# Patient Record
Sex: Male | Born: 1965 | Race: Black or African American | Hispanic: No | Marital: Single | Smoking: Current every day smoker
Health system: Southern US, Community
[De-identification: ages and names within clinical notes are randomized; demographics above are authoritative.]

## PROBLEM LIST (undated history)

## (undated) ENCOUNTER — Emergency Department (HOSPITAL_COMMUNITY): Admission: EM | Payer: Self-pay | Source: Home / Self Care

## (undated) DIAGNOSIS — T7840XA Allergy, unspecified, initial encounter: Secondary | ICD-10-CM

## (undated) DIAGNOSIS — I82409 Acute embolism and thrombosis of unspecified deep veins of unspecified lower extremity: Secondary | ICD-10-CM

## (undated) HISTORY — DX: Allergy, unspecified, initial encounter: T78.40XA

## (undated) HISTORY — PX: HERNIA REPAIR: SHX51

---

## 1997-08-22 ENCOUNTER — Emergency Department (HOSPITAL_COMMUNITY): Admission: EM | Admit: 1997-08-22 | Discharge: 1997-08-22 | Payer: Self-pay

## 1998-04-23 ENCOUNTER — Emergency Department (HOSPITAL_COMMUNITY): Admission: EM | Admit: 1998-04-23 | Discharge: 1998-04-23 | Payer: Self-pay | Admitting: Internal Medicine

## 1998-05-09 ENCOUNTER — Emergency Department (HOSPITAL_COMMUNITY): Admission: EM | Admit: 1998-05-09 | Discharge: 1998-05-09 | Payer: Self-pay | Admitting: Internal Medicine

## 1999-02-12 DIAGNOSIS — Z87898 Personal history of other specified conditions: Secondary | ICD-10-CM

## 1999-12-30 ENCOUNTER — Encounter: Payer: Self-pay | Admitting: Emergency Medicine

## 1999-12-30 ENCOUNTER — Inpatient Hospital Stay (HOSPITAL_COMMUNITY): Admission: EM | Admit: 1999-12-30 | Discharge: 2000-01-04 | Payer: Self-pay | Admitting: Emergency Medicine

## 2000-01-02 ENCOUNTER — Encounter: Payer: Self-pay | Admitting: General Surgery

## 2003-05-30 ENCOUNTER — Emergency Department (HOSPITAL_COMMUNITY): Admission: EM | Admit: 2003-05-30 | Discharge: 2003-05-30 | Payer: Self-pay | Admitting: Emergency Medicine

## 2004-03-13 ENCOUNTER — Emergency Department (HOSPITAL_COMMUNITY): Admission: EM | Admit: 2004-03-13 | Discharge: 2004-03-13 | Payer: Self-pay | Admitting: Emergency Medicine

## 2005-09-08 ENCOUNTER — Emergency Department (HOSPITAL_COMMUNITY): Admission: EM | Admit: 2005-09-08 | Discharge: 2005-09-08 | Payer: Self-pay | Admitting: Emergency Medicine

## 2006-11-11 ENCOUNTER — Ambulatory Visit: Payer: Self-pay | Admitting: Nurse Practitioner

## 2006-11-11 ENCOUNTER — Ambulatory Visit (HOSPITAL_COMMUNITY): Admission: RE | Admit: 2006-11-11 | Discharge: 2006-11-11 | Payer: Self-pay | Admitting: Nurse Practitioner

## 2006-11-11 ENCOUNTER — Ambulatory Visit: Payer: Self-pay | Admitting: *Deleted

## 2006-11-11 DIAGNOSIS — S335XXA Sprain of ligaments of lumbar spine, initial encounter: Secondary | ICD-10-CM | POA: Insufficient documentation

## 2006-11-11 LAB — CONVERTED CEMR LAB
Bilirubin Urine: NEGATIVE
HDL: 29 mg/dL — ABNORMAL LOW (ref >39)
Ketones, urine, test strip: NEGATIVE
Specific Gravity, Urine: >=1.03
Urobilinogen, UA: 1
pH: 5.5

## 2006-11-13 ENCOUNTER — Encounter (INDEPENDENT_AMBULATORY_CARE_PROVIDER_SITE_OTHER): Payer: Self-pay | Admitting: Nurse Practitioner

## 2006-11-13 LAB — CONVERTED CEMR LAB
Albumin: 4.6 g/dL (ref 3.5–5.2)
CO2: 20 meq/L (ref 19–32)
Calcium: 9.3 mg/dL (ref 8.4–10.5)
Chloride: 106 meq/L (ref 96–112)
Glucose, Bld: 86 mg/dL (ref 70–99)
Potassium: 4.2 meq/L (ref 3.5–5.3)
Sodium: 140 meq/L (ref 135–145)
TSH: 1.173 microintl units/mL (ref 0.350–5.50)
Total Protein: 7.9 g/dL (ref 6.0–8.3)

## 2006-11-25 ENCOUNTER — Ambulatory Visit: Payer: Self-pay | Admitting: Nurse Practitioner

## 2006-11-25 DIAGNOSIS — K219 Gastro-esophageal reflux disease without esophagitis: Secondary | ICD-10-CM

## 2006-11-25 DIAGNOSIS — E7800 Pure hypercholesterolemia, unspecified: Secondary | ICD-10-CM

## 2006-12-10 ENCOUNTER — Telehealth (INDEPENDENT_AMBULATORY_CARE_PROVIDER_SITE_OTHER): Payer: Self-pay | Admitting: *Deleted

## 2006-12-19 ENCOUNTER — Encounter (INDEPENDENT_AMBULATORY_CARE_PROVIDER_SITE_OTHER): Payer: Self-pay | Admitting: Nurse Practitioner

## 2006-12-29 ENCOUNTER — Ambulatory Visit: Payer: Self-pay | Admitting: Nurse Practitioner

## 2007-01-07 ENCOUNTER — Encounter (INDEPENDENT_AMBULATORY_CARE_PROVIDER_SITE_OTHER): Payer: Self-pay | Admitting: Nurse Practitioner

## 2007-01-13 ENCOUNTER — Encounter: Admission: RE | Admit: 2007-01-13 | Discharge: 2007-04-13 | Payer: Self-pay | Admitting: Nurse Practitioner

## 2007-01-13 ENCOUNTER — Encounter (INDEPENDENT_AMBULATORY_CARE_PROVIDER_SITE_OTHER): Payer: Self-pay | Admitting: Nurse Practitioner

## 2007-01-30 ENCOUNTER — Telehealth (INDEPENDENT_AMBULATORY_CARE_PROVIDER_SITE_OTHER): Payer: Self-pay | Admitting: *Deleted

## 2007-02-27 ENCOUNTER — Encounter (INDEPENDENT_AMBULATORY_CARE_PROVIDER_SITE_OTHER): Payer: Self-pay | Admitting: Nurse Practitioner

## 2007-05-07 ENCOUNTER — Emergency Department (HOSPITAL_COMMUNITY): Admission: EM | Admit: 2007-05-07 | Discharge: 2007-05-07 | Payer: Self-pay | Admitting: Family Medicine

## 2007-05-10 ENCOUNTER — Emergency Department (HOSPITAL_COMMUNITY): Admission: EM | Admit: 2007-05-10 | Discharge: 2007-05-10 | Payer: Self-pay | Admitting: Emergency Medicine

## 2007-05-21 ENCOUNTER — Encounter (INDEPENDENT_AMBULATORY_CARE_PROVIDER_SITE_OTHER): Payer: Self-pay | Admitting: Nurse Practitioner

## 2007-08-25 ENCOUNTER — Emergency Department (HOSPITAL_COMMUNITY): Admission: EM | Admit: 2007-08-25 | Discharge: 2007-08-25 | Payer: Self-pay | Admitting: Family Medicine

## 2007-09-09 ENCOUNTER — Ambulatory Visit: Payer: Self-pay | Admitting: Nurse Practitioner

## 2007-09-09 DIAGNOSIS — M940 Chondrocostal junction syndrome [Tietze]: Secondary | ICD-10-CM

## 2008-11-12 ENCOUNTER — Emergency Department (HOSPITAL_COMMUNITY): Admission: EM | Admit: 2008-11-12 | Discharge: 2008-11-12 | Payer: Self-pay | Admitting: Emergency Medicine

## 2009-12-22 ENCOUNTER — Emergency Department (HOSPITAL_COMMUNITY): Admission: EM | Admit: 2009-12-22 | Discharge: 2009-12-22 | Payer: Self-pay | Admitting: Emergency Medicine

## 2009-12-25 ENCOUNTER — Ambulatory Visit: Payer: Self-pay | Admitting: Nurse Practitioner

## 2009-12-25 DIAGNOSIS — F17200 Nicotine dependence, unspecified, uncomplicated: Secondary | ICD-10-CM

## 2009-12-25 DIAGNOSIS — J029 Acute pharyngitis, unspecified: Secondary | ICD-10-CM

## 2009-12-26 DIAGNOSIS — E049 Nontoxic goiter, unspecified: Secondary | ICD-10-CM | POA: Insufficient documentation

## 2009-12-26 LAB — CONVERTED CEMR LAB
ALT: 20 units/L (ref 0–53)
Albumin: 4.7 g/dL (ref 3.5–5.2)
Alkaline Phosphatase: 35 units/L — ABNORMAL LOW (ref 39–117)
Basophils Absolute: 0.1 10*3/uL (ref 0.0–0.1)
CO2: 26 meq/L (ref 19–32)
Glucose, Bld: 90 mg/dL (ref 70–99)
Lymphocytes Relative: 41 % (ref 12–46)
Lymphs Abs: 3.8 10*3/uL (ref 0.7–4.0)
Neutrophils Relative %: 51 % (ref 43–77)
Platelets: 197 10*3/uL (ref 150–400)
Potassium: 5.1 meq/L (ref 3.5–5.3)
RDW: 15.2 % (ref 11.5–15.5)
Sodium: 136 meq/L (ref 135–145)
TSH: 131.625 microintl units/mL — ABNORMAL HIGH (ref 0.350–4.500)
Total Protein: 8.2 g/dL (ref 6.0–8.3)
WBC: 9.2 10*3/uL (ref 4.0–10.5)

## 2009-12-27 ENCOUNTER — Ambulatory Visit (HOSPITAL_COMMUNITY): Admission: RE | Admit: 2009-12-27 | Discharge: 2009-12-27 | Payer: Self-pay | Admitting: Internal Medicine

## 2009-12-28 ENCOUNTER — Telehealth (INDEPENDENT_AMBULATORY_CARE_PROVIDER_SITE_OTHER): Payer: Self-pay | Admitting: Nurse Practitioner

## 2010-02-21 ENCOUNTER — Telehealth (INDEPENDENT_AMBULATORY_CARE_PROVIDER_SITE_OTHER): Payer: Self-pay | Admitting: Nurse Practitioner

## 2010-02-22 ENCOUNTER — Ambulatory Visit: Admit: 2010-02-22 | Payer: Self-pay | Admitting: Nurse Practitioner

## 2010-02-26 ENCOUNTER — Encounter (INDEPENDENT_AMBULATORY_CARE_PROVIDER_SITE_OTHER): Payer: Self-pay | Admitting: Nurse Practitioner

## 2010-03-15 NOTE — Progress Notes (Signed)
Summary: TEST RESULT  Phone Note Call from Patient Call back at [phone]   Summary of Call: PATIENT IS CALLING FOR TEST RESULTS Initial call taken by: Domenic Polite,  December 28, 2009 4:17 PM  Follow-up for Phone Call        Notified of test results, new Rx and f/u lab appt. made. Follow-up by: Dutch Quint RN,  December 28, 2009 4:40 PM

## 2010-03-15 NOTE — Assessment & Plan Note (Signed)
Summary: Sore throat   Vital Signs:  Patient profile:   45 year old male Weight:      208 pounds BMI:     28.31 Temp:     98.2 degrees F oral Pulse rate:   68 / minute Pulse rhythm:   regular Resp:     16 per minute BP sitting:   130 / 84  (left arm) Cuff size:   regular  Vitals Entered By: Hale Drone CMA (December 25, 2009 4:47 PM)  Nutrition Counseling: Patient's BMI is greater than 25 and therefore counseled on weight management options. CC: Pt. is here b/c of a knot on the throat since last Monday. No pain associated but states he does wake up in the middle of the night coughing w/ a sensation of choking. Pt. sometimes has difficulty breathing. Can not wear button up shirts b/c it is uncomfortable. Pt. also feels it is affecting his voice.  Is Patient Diabetic? Yes Pain Assessment Patient in pain? no       Does patient need assistance? Functional Status Self care Ambulation Normal   CC:  Pt. is here b/c of a knot on the throat since last Monday. No pain associated but states he does wake up in the middle of the night coughing w/ a sensation of choking. Pt. sometimes has difficulty breathing. Can not wear button up shirts b/c it is uncomfortable. Pt. also feels it is affecting his voice. Marland Kitchen  History of Present Illness:  pt into the office with c/o sore throat Started 1 week ago +hoarseness "I feel like something is stuck in my throat" Symptoms started the day after he was hollering during an [employer] game. +tobacco use No change in appetite Area seems to enlarge with talking  Habits & Providers  Alcohol-Tobacco-Diet     Alcohol drinks/day: 0     Tobacco Status: current     Cigarette Packs/Day: 4 cigs daily     Year Started: 1985     Passive Smoke Exposure: no  Exercise-Depression-Behavior     Does Patient Exercise: no     Drug Use: no     Seat Belt Use: 100     Sun Exposure: occasionally  Current Medications (verified): 1)  Flexeril 10 Mg  Tabs  (Cyclobenzaprine Hcl) .Marland Kitchen.. 1 Tablet By Mouth At Night For Muscles 2)  Cyclobenzaprine Hcl 10 Mg  Tabs (Cyclobenzaprine Hcl) .... Take 1 Tab 3 Times A Day For Muscle Spasm *mark Mcpherson, Md 3)  Ibuprofen 800 Mg  Tabs (Ibuprofen) .... Take One Tablet 3 Times A Day As Need For Swelling and Pain *mark Macpherson,md  Allergies (verified): No Known Drug Allergies  Review of Systems General:  Denies fever and loss of appetite. ENT:  Complains of hoarseness and sore throat. CV:  Denies chest pain or discomfort. Resp:  Denies cough. GI:  Denies abdominal pain, nausea, and vomiting.  Physical Exam  General:  alert.   Head:  normocephalic.   Neck:  ? right thyroid enlargement Heart:  normal rate and regular rhythm.   Abdomen:  normal bowel sounds.   Msk:  normal ROM.   Neurologic:  alert & oriented X3.     Impression & Recommendations:  Problem # 1:  SORE THROAT (ICD-462) ? if problems due to pharngitis handout given  advised rest of vocal cords out of work for next 2 days due to need for excessive talking/shouting at work if symptoms persist then may need ENT referral The following medications were removed from  the medication list:    Ibuprofen 800 Mg Tabs (Ibuprofen) .Marland Kitchen... 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 3)  If symptoms no better by the end of this week then inform this office.  You will need referral to specialist to look down your throat. 4)  You have declined the flu  vaccine today - if you change your mind inform this office.   Orders Added: 1)  Est. Patient Level III [99213] 2)  T-TSH [[id number]] 3)  T-CBC w/Diff [[id number]] 4)  T-Comprehensive Metabolic Panel [[id number]] 5)  Rapid HIV  [92370]

## 2010-03-15 NOTE — Letter (Signed)
Summary: Handout Printed  Printed Handout:  - Laryngitis 

## 2010-03-15 NOTE — Letter (Signed)
Summary: Generic Letter  [facility]  [address] [place], [place] [address]   Phone: [phone]  Fax: [phone]        02/26/2010  Curtis Barr [address] [place], [place]  [address]  Dear Curtis Barr,  We have been unable to contact you by telephone.  Please call our office, at your earliest convenience, so that we may speak with you.  Sincerely,   Dutch Quint RN

## 2010-03-15 NOTE — Progress Notes (Signed)
Summary: PULLED A MUSCLE IN BACK  Phone Note Call from Patient Call back at Home Phone [phone]   Reason for Call: Talk to Nurse Summary of Call: Curtis PT. MR Curtis Barr THAT HE PULLED A MUSCLE IN HIS UPPER BACK ON HIS JOB LAST FRIDAY. HE IS WONDERING IF YOU CAN PRESCRIBE FLEXERIL AGAIN FOR HIM. HE Barr THAT YOU HAD GIVEN HIM SOME BEFORE FOR HIS BACK.  HE USES [facility] ON [address] Initial call taken by: [name],  February 21, 2010 10:45 AM  Follow-up for Phone Call        Was pulling a heavy water hose at work on Friday, pulled muscle in left upper back/shoulder, posterior arm, very painful, sharp pain, getting progressively worse.  Hurts to breathe, also when reaching up.  Pain occurs only when he moves, hurts badly when he gets up in the morning.  Has tried ibuprofen, without relief.  No relief with warm showers.   Did not report injury - advised to report work injury as soon as possible.  Would like flexeril. Follow-up by: Dutch Quint RN,  February 21, 2010 11:01 AM  Additional Follow-up for Phone Call Additional follow up Details #1::        Yes, pt did have flexeril before so will refil. advise pt to take ibuprofen 800mg  by mouth two times a day with food (rx written) and may take flexeril 10mg  by mouth at night (rx in basket) This will likely take 5-7 days for resolution and then when even then he will need to limit lifting and heavy pulling to prevent recurrence.  applying a heat pad to the affected area is helpful Additional Follow-up by: Lehman Prom FNP,  February 22, 2010 8:00 AM    Additional Follow-up for Phone Call Additional follow up Details #2::    Rxs faxed to [facility] [address]  Left message on answering machine for pt. to return call.  Dutch Quint RN  February 22, 2010 10:46 AM  Left message on answering machine for pt. to return call.  Dutch Quint RN  February 22, 2010 3:10 PM  Left message on answering machine for pt. to return call.  Dutch Quint RN   February 23, 2010 12:57 PM  Left message on answering machine for pt. to return call.  Letter sent.  Dutch Quint RN  February 26, 2010 5:04 PM   New/Updated Medications: CYCLOBENZAPRINE HCL 10 MG TABS (CYCLOBENZAPRINE HCL) One tablet by mouth nightly as needed for muscles IBUPROFEN 800 MG TABS (IBUPROFEN) One tablet by mouth two times a day as needed for pain Prescriptions: IBUPROFEN 800 MG TABS (IBUPROFEN) One tablet by mouth two times a day as needed for pain  #50 x 0   Entered and Authorized by:   Lehman Prom FNP   Signed by:   Lehman Prom FNP on 02/22/2010   Method used:   Printed then faxed to ...       [facility] [address] [phone]* (retail)       [address]       [place], [place]  [address]       Ph: [phone]       Fax: [phone]   RxID:   [id number] CYCLOBENZAPRINE HCL 10 MG TABS (CYCLOBENZAPRINE HCL) One tablet by mouth nightly as needed for muscles  #30 x 0   Entered and Authorized by:   Lehman Prom FNP   Signed by:   Lehman Prom FNP on 02/22/2010   Method used:  Print then Give to Patient   RxID:   [id number]   Appended Document: PULLED A MUSCLE IN BACK Pt. returned call, advised of provider's instructions and new Rx.  Feeling better.  Dutch Quint RN  February 27, 2010  9:16 AM

## 2010-03-15 NOTE — Letter (Signed)
Summary: Out of Work  [facility]  [address] [place], [place] [address]   Phone: [phone]  Fax: [phone]    December 25, 2009   Employee:  Curtis Barr    To Whom It May Concern:   For Medical reasons, please excuse the above named employee from work for the following dates:  Start:   December 25, 2009 (Monday)  End:   December 28, 2009 (Thursday)  If you need additional information, please feel free to contact our office.         Sincerely,    Lehman Prom FNP

## 2010-03-27 ENCOUNTER — Emergency Department (HOSPITAL_COMMUNITY): Payer: Self-pay

## 2010-03-27 ENCOUNTER — Emergency Department (HOSPITAL_COMMUNITY)
Admission: EM | Admit: 2010-03-27 | Discharge: 2010-03-27 | Disposition: A | Discharge status: Home / Self Care | Payer: Self-pay | Attending: Emergency Medicine | Admitting: Emergency Medicine

## 2010-03-27 DIAGNOSIS — R0789 Other chest pain: Secondary | ICD-10-CM | POA: Insufficient documentation

## 2010-03-27 LAB — POCT CARDIAC MARKERS: Myoglobin, poc: 57.6 ng/mL (ref 12–200)

## 2010-03-27 LAB — POCT I-STAT, CHEM 8
BUN: 13 mg/dL (ref 6–23)
Calcium, Ion: 1.07 mmol/L — ABNORMAL LOW (ref 1.12–1.32)
HCT: 46 % (ref 39.0–52.0)
Hemoglobin: 15.6 g/dL (ref 13.0–17.0)
Sodium: 137 mEq/L (ref 135–145)
TCO2: 27 mmol/L (ref 0–100)

## 2010-03-27 LAB — DIFFERENTIAL
Basophils Relative: 1 % (ref 0–1)
Eosinophils Absolute: 0.2 10*3/uL (ref 0.0–0.7)
Eosinophils Relative: 3 % (ref 0–5)
Lymphs Abs: 2.9 10*3/uL (ref 0.7–4.0)
Monocytes Relative: 5 % (ref 3–12)
Neutrophils Relative %: 62 % (ref 43–77)

## 2010-03-27 LAB — CBC
MCH: 32.3 pg (ref 26.0–34.0)
MCV: 91.5 fL (ref 78.0–100.0)
Platelets: 208 10*3/uL (ref 150–400)
RBC: 4.58 MIL/uL (ref 4.22–5.81)
RDW: 14.2 % (ref 11.5–15.5)

## 2010-06-29 NOTE — Op Note (Signed)
[facility]  Patient:    Curtis Barr, Curtis Barr                    MRN: [id number] Proc. Date: 12/30/99 Attending:  Velora Heckler, M.D. CC:         Dr. Verlan Friends, Emergency Department   Operative Report  PREOPERATIVE DIAGNOSIS:  Incarcerated right inguinal hernia, rule out strangulation, small bowel obstruction.  POSTOPERATIVE DIAGNOSIS:  Incarcerated right inguinal hernia, rule out strangulation, small bowel obstruction.  PROCEDURE:  1. Repair of incarcerated right inguinal hernia with Prolene mesh. 2. Exploratory laparotomy.  SURGEON:  Velora Heckler, M.D.  ANESTHESIA:  General, per Dr. Shireen Quan.  ESTIMATED BLOOD LOSS:  Minimal.  PREPARATION:  Betadine.  COMPLICATIONS:  None.  INDICATIONS:  Patient is a 45 year old black male who presented to the emergency department with a 48-hour history of incarcerated right inguinal hernia, followed by onset of abdominal pain, abdominal distention and nausea and vomiting.  Abdominal x-rays showed small bowel obstruction.  Patient is brought urgently to the operating room for hernia repair and possible bowel resection.  DESCRIPTION OF PROCEDURE: Patient was brought to the operating room and placed in a supine position on the operating room table.  Following administration of general anesthesia the patient is prepped and draped in the usual strict aseptic fashion.  After ascertaining an adequate level of anesthesia had been obtained, a right inguinal incision was made with a #10 blade.  Dissection was carried down through the subcutaneous tissues and hemostasis obtained with the electrocautery.  External oblique fascia was incised in line with its fibers and extended through the external ring from which emerges a large indirect inguinal hernia extending into the right hemiscrotum.  The cord structures were encircled with a Penrose drain.  The hernia sac was quite large, thick walled, and upon opening, the  bowel spontaneously reduced back within the peritoneal cavity.  A segment of the hernia sac was excised.  The hernia sac was ligated at the internal ring with interrupted 2-0 silk sutures.  Good hemostasis was noted.  The floor of the inguinal canal was then delineated. The floor was recreated with a sheet of Prolene mesh.  The mesh was secured to the pubic tubercle and along the inguinal ligament with a running 2-0 Novofil suture.  The mesh was split to accommodate the cord structures.  The superior margin of the mesh was secured to the transversalis and internal oblique fascia with interrupted 2-0 Novofil sutures.  Tails of the mesh were overlapped laterally and the inferior edges secured to the inguinal ligament with an interrupted 2-0 Novofil suture.  Local field block was placed.  Cord structures were returned to the inguinal canal. The external oblique fascia was closed with interrupted 3-0 Vicryl sutures. Subcutaneous tissues were closed with interrupted 3-0 Vicryl sutures.  Skin edges were reapproximated with interrupted 4-0 Vicryl subcuticular sutures.  Next the abdomen was explored through a midline incision just below the umbilicus.  The fascia was incised and the peritoneal cavity was entered cautiously.  Small bowel was run from proximal to distal.  The portion of the  bowel which had been incarcerated in the right inguinal hernia was a Richters hernia involving a portion of the cecal wall and the terminal ileum.  The bowel wall was viable. There was no sign of ischemia or infarction.  There was no sign of stricture. Bowel was returned to the peritoneal cavity.  Midline wound was closed with a running #1 PDS suture.  The subcutaneous tissues were irrigated.  Skin edges were closed with interrupted 4-0 Vicryl subcuticular sutures.  The wound was washed and benzoin and Steri-Strips were applied.  Sterile gauze dressings were applied.  The patient was awakened from anesthesia and  brought to the recovery room in stable condition.  The patient tolerated the procedure well. DD:  12/30/99 TD:  12/30/99 Job: [id number]

## 2010-06-29 NOTE — Discharge Summary (Signed)
[facility]  Patient:    Curtis Barr, Curtis Barr                    MRN: [id number] Adm. Date:  [id number] Disc. Date: [id number] Attending:  Bonnetta Barry                           Discharge Summary  REASON FOR ADMISSION:  Incarcerated right inguinal hernia with small-bowel obstruction.  HISTORY OF PRESENT ILLNESS:  The patient is a 45 year old black male who presented to the emergency department with a two-year history of right inguinal hernia.  This has always been reducible.  While playing basketball on December 28, 1999, apparently the hernia became incarcerated.  The patient had progressive discomfort and abdominal distention.  He developed abdominal pain, nausea, and vomiting.  He was unable to tolerate oral intake.  He presented to the emergency department 48 hours later with small-bowel obstruction.  The patient was evaluated, and general surgery was consulted.  HOSPITAL COURSE:  The patient was seen in the emergency department, prepared, and taken to the operating room, where he underwent repair of right inguinal hernia with Prolene mesh and an exploratory laparotomy for small-bowel obstruction.  Postoperative course was straightforward.  He had gradual resolution of his ileus.  His diet was slowly advanced.  He was prepared for discharge home on the fifth postoperative day.  FOLLOW-UP:  The patient will be seen back in my office at [facility] in two weeks.  DISCHARGE MEDICATIONS:  Tylox as needed for pain.  FINAL DIAGNOSES: 1. Incarcerated right inguinal hernia. 2. Small-bowel obstruction.  CONDITION ON DISCHARGE:  Improved. DD:  01/27/00 TD:  01/28/00 Job: [id number]

## 2010-10-27 ENCOUNTER — Emergency Department (HOSPITAL_COMMUNITY): Payer: Self-pay

## 2010-10-27 ENCOUNTER — Emergency Department (HOSPITAL_COMMUNITY)
Admission: EM | Admit: 2010-10-27 | Discharge: 2010-10-27 | Disposition: A | Discharge status: Home / Self Care | Payer: Self-pay | Attending: Emergency Medicine | Admitting: Emergency Medicine

## 2010-10-27 DIAGNOSIS — R071 Chest pain on breathing: Secondary | ICD-10-CM | POA: Insufficient documentation

## 2010-10-27 DIAGNOSIS — R062 Wheezing: Secondary | ICD-10-CM | POA: Insufficient documentation

## 2010-10-27 DIAGNOSIS — R0602 Shortness of breath: Secondary | ICD-10-CM | POA: Insufficient documentation

## 2010-10-27 DIAGNOSIS — J189 Pneumonia, unspecified organism: Secondary | ICD-10-CM | POA: Insufficient documentation

## 2010-11-08 ENCOUNTER — Emergency Department (HOSPITAL_COMMUNITY): Payer: Self-pay

## 2010-11-08 ENCOUNTER — Emergency Department (HOSPITAL_COMMUNITY)
Admission: EM | Admit: 2010-11-08 | Discharge: 2010-11-08 | Discharge status: Against Medical Advice | Payer: Self-pay | Attending: Emergency Medicine | Admitting: Emergency Medicine

## 2010-11-08 DIAGNOSIS — J90 Pleural effusion, not elsewhere classified: Secondary | ICD-10-CM | POA: Insufficient documentation

## 2010-11-08 DIAGNOSIS — J13 Pneumonia due to Streptococcus pneumoniae: Secondary | ICD-10-CM

## 2010-11-08 DIAGNOSIS — R079 Chest pain, unspecified: Secondary | ICD-10-CM | POA: Insufficient documentation

## 2010-11-08 DIAGNOSIS — R0602 Shortness of breath: Secondary | ICD-10-CM | POA: Insufficient documentation

## 2010-11-08 DIAGNOSIS — M549 Dorsalgia, unspecified: Secondary | ICD-10-CM | POA: Insufficient documentation

## 2010-11-25 ENCOUNTER — Emergency Department (HOSPITAL_COMMUNITY)
Admission: EM | Admit: 2010-11-25 | Discharge: 2010-11-25 | Disposition: A | Discharge status: Home / Self Care | Payer: Self-pay | Attending: Emergency Medicine | Admitting: Emergency Medicine

## 2010-11-25 ENCOUNTER — Emergency Department (HOSPITAL_COMMUNITY): Payer: Self-pay

## 2010-11-25 DIAGNOSIS — R0789 Other chest pain: Secondary | ICD-10-CM | POA: Insufficient documentation

## 2010-11-25 DIAGNOSIS — R0602 Shortness of breath: Secondary | ICD-10-CM | POA: Insufficient documentation

## 2010-11-25 DIAGNOSIS — J189 Pneumonia, unspecified organism: Secondary | ICD-10-CM | POA: Insufficient documentation

## 2011-11-12 ENCOUNTER — Emergency Department (INDEPENDENT_AMBULATORY_CARE_PROVIDER_SITE_OTHER): Payer: Self-pay

## 2011-11-12 ENCOUNTER — Encounter (HOSPITAL_COMMUNITY): Payer: Self-pay | Admitting: *Deleted

## 2011-11-12 ENCOUNTER — Emergency Department (HOSPITAL_COMMUNITY)
Admission: EM | Admit: 2011-11-12 | Discharge: 2011-11-12 | Disposition: A | Discharge status: Home / Self Care | Payer: Self-pay | Attending: Emergency Medicine | Admitting: Emergency Medicine

## 2011-11-12 ENCOUNTER — Emergency Department (INDEPENDENT_AMBULATORY_CARE_PROVIDER_SITE_OTHER)
Admission: EM | Admit: 2011-11-12 | Discharge: 2011-11-12 | Disposition: A | Discharge status: Nursing Home (Includes ICF) | Payer: Self-pay | Source: Home / Self Care | Attending: Emergency Medicine | Admitting: Emergency Medicine

## 2011-11-12 ENCOUNTER — Encounter (HOSPITAL_COMMUNITY): Payer: Self-pay | Admitting: Emergency Medicine

## 2011-11-12 DIAGNOSIS — R071 Chest pain on breathing: Secondary | ICD-10-CM

## 2011-11-12 DIAGNOSIS — F17200 Nicotine dependence, unspecified, uncomplicated: Secondary | ICD-10-CM | POA: Insufficient documentation

## 2011-11-12 DIAGNOSIS — M5430 Sciatica, unspecified side: Secondary | ICD-10-CM | POA: Insufficient documentation

## 2011-11-12 DIAGNOSIS — R091 Pleurisy: Secondary | ICD-10-CM

## 2011-11-12 DIAGNOSIS — M79606 Pain in leg, unspecified: Secondary | ICD-10-CM

## 2011-11-12 DIAGNOSIS — M5432 Sciatica, left side: Secondary | ICD-10-CM

## 2011-11-12 DIAGNOSIS — J90 Pleural effusion, not elsewhere classified: Secondary | ICD-10-CM | POA: Insufficient documentation

## 2011-11-12 DIAGNOSIS — R0781 Pleurodynia: Secondary | ICD-10-CM

## 2011-11-12 DIAGNOSIS — M79609 Pain in unspecified limb: Secondary | ICD-10-CM

## 2011-11-12 LAB — CBC WITH DIFFERENTIAL/PLATELET
Basophils Absolute: 0.1 10*3/uL (ref 0.0–0.1)
Eosinophils Absolute: 0.2 10*3/uL (ref 0.0–0.7)
Eosinophils Relative: 4 % (ref 0–5)
HCT: 34.4 % — ABNORMAL LOW (ref 39.0–52.0)
Lymphocytes Relative: 49 % — ABNORMAL HIGH (ref 12–46)
MCH: 31.9 pg (ref 26.0–34.0)
MCHC: 34.9 g/dL (ref 30.0–36.0)
MCV: 91.5 fL (ref 78.0–100.0)
Monocytes Absolute: 0.3 10*3/uL (ref 0.1–1.0)
Platelets: 185 10*3/uL (ref 150–400)
RDW: 14.9 % (ref 11.5–15.5)
WBC: 6.8 10*3/uL (ref 4.0–10.5)

## 2011-11-12 LAB — POCT I-STAT, CHEM 8
Calcium, Ion: 1.24 mmol/L — ABNORMAL HIGH (ref 1.12–1.23)
Chloride: 102 mEq/L (ref 96–112)
Glucose, Bld: 85 mg/dL (ref 70–99)
HCT: 38 % — ABNORMAL LOW (ref 39.0–52.0)
TCO2: 27 mmol/L (ref 0–100)

## 2011-11-12 LAB — D-DIMER, QUANTITATIVE: D-Dimer, Quant: <0.27 ug/mL-FEU (ref 0.00–0.48)

## 2011-11-12 MED ORDER — HYDROCODONE-ACETAMINOPHEN 5-500 MG PO TABS: 1.0000 | ORAL_TABLET | Freq: Four times a day (QID) | ORAL | Status: DC | PRN

## 2011-11-12 MED ORDER — PREDNISONE 10 MG PO TABS: ORAL_TABLET | ORAL | Status: DC

## 2011-11-12 MED ORDER — CYCLOBENZAPRINE HCL 10 MG PO TABS: 10.0000 mg | ORAL_TABLET | Freq: Two times a day (BID) | ORAL | Status: DC | PRN

## 2011-11-12 NOTE — ED Provider Notes (Signed)
History     CSN: [id number]  Arrival date & time 11/12/11  8841   First MD Initiated Contact with Patient 11/12/11 0827      Chief Complaint  Patient presents with  . Leg Pain  . Chest Pain    (Consider location/radiation/quality/duration/timing/severity/associated sxs/prior treatment) HPI Comments: Mr.Desroches, presents urgent care this morning 2 coexistent complaints. Patient describes a right-sided chest tightness and discomfort, exacerbated with laying flat and deep inhalation "it feels tight",  Patient reports that he's been having this pain for many months now and saw his primary care Dr. back in August for the same reason. Patient denies any cough, fevers, changes in appetite or difficulty breathing. Patient relates that he had pneumonia in September of last year, as patient persisted with pain after having developed this pneumonia his primary care Dr. wanted to do other studies. (Patient points to lateral aspect of right rib cage).   Patient also describes ongoing pains related to movements and activities on the posterior aspect of his left thigh. Denies any recent trauma or injury. He denies any back pain, unintentional weight loss, fevers, or urinary symptoms.   Patient is a 46 y.o. male presenting with leg pain and chest pain.  Leg Pain  The incident occurred more than 1 week ago. There was no injury mechanism. The pain is present in the left leg. The pain is at a severity of 8/10. The pain is moderate. The pain has been intermittent since onset. Associated symptoms include inability to bear weight. Pertinent negatives include no loss of motion, no muscle weakness, no loss of sensation and no tingling. He has tried nothing for the symptoms. The treatment provided no relief.  Chest Pain Pertinent negatives for primary symptoms include no fever, no fatigue, no shortness of breath, no cough, no wheezing, no palpitations and no dizziness.     History reviewed. No pertinent past  medical history.  Past Surgical History  Procedure Date  . Hernia repair     Family History  Problem Relation Age of Onset  . Heart failure Father     History  Substance Use Topics  . Smoking status: Current Every Day Smoker -- 0.5 packs/day    Types: Cigarettes  . Smokeless tobacco: Not on file  . Alcohol Use: No      Review of Systems  Constitutional: Positive for activity change. Negative for fever, chills, appetite change, fatigue and unexpected weight change.  Respiratory: Positive for chest tightness. Negative for cough, choking, shortness of breath and wheezing.   Cardiovascular: Positive for chest pain. Negative for palpitations and leg swelling.  Genitourinary: Negative for dysuria.  Musculoskeletal: Negative for myalgias, back pain and joint swelling.  Skin: Negative for color change, rash and wound.  Neurological: Negative for dizziness and tingling.    Allergies  Review of patient's allergies indicates no known allergies.  Home Medications  No current outpatient prescriptions on file.  BP 137/92  Pulse 68  Temp 97.3 F (36.3 C) (Oral)  Resp 18  SpO2 98%  Physical Exam  Nursing note and vitals reviewed. Constitutional: Vital signs are normal. He appears well-developed and well-nourished.  Non-toxic appearance. He does not have a sickly appearance. He does not appear ill. No distress.  Neck: No JVD present.  Cardiovascular: Normal rate.  Exam reveals no gallop and no friction rub.   No murmur heard. Pulmonary/Chest: Effort normal. No respiratory distress. He has no decreased breath sounds. He has no wheezes. He has no rhonchi. He has no  rales. He exhibits no tenderness.  Abdominal: Soft. He exhibits no distension. There is no tenderness.  Lymphadenopathy:    He has no cervical adenopathy.  Skin: No rash noted. No erythema.    ED Course  Procedures (including critical care time)  Labs Reviewed - No data to display Dg Chest 2 View  11/12/2011   *RADIOLOGY REPORT*  Clinical Data: Chest pain, leg pain  CHEST - 2 VIEW  Comparison: 11/25/2010  Findings: Cardiomediastinal silhouette is stable.  No acute infiltrate or pulmonary edema.  Bony thorax is stable.  Trace residual right pleural effusion or pleural thickening with blunting of the right costophrenic angle.  IMPRESSION: No acute infiltrate or pulmonary edema.  Bony thorax is stable. Trace residual right pleural effusion or pleural thickening with blunting of the right costophrenic angle.   Original Report Authenticated By: Natasha Mead, M.D.      1. Chest pain, pleuritic   2. Leg pain       MDM          Jimmie Molly, MD 11/12/11 [phone]

## 2011-11-12 NOTE — ED Provider Notes (Signed)
History     CSN: [id number]  Arrival date & time 11/12/11  9562   First MD Initiated Contact with Patient 11/12/11 1014      Chief Complaint  Patient presents with  . Leg Pain    (Consider location/radiation/quality/duration/timing/severity/associated sxs/prior treatment) HPI Comments: Curtis Barr is a 46 y.o. Male who presents with complaint of pain in left leg for about 3 weeks, and pleuritic pressure in right chest for 2 months. Pt states he had pneumonia 1 year on on the right side, and has had some discomfort on that side on and off since then. This time, it started 2 months ago, and only present with deep breathing. States no fever, cough, no pain with movement or if he is breathing normally. Pt states pain in left leg started about 3 weeks ago. States pain goes from left buttocks to the back of the thigh and left calf. States feels like "cramps." Denies numbness or weakness in feet. Denies back pain. Denies swelling of the leg. No recent long trips or surgeries. No hx of blood clots or similar pain in the past. Pain in leg worsened with movement and certain position. Taking ibuprofen for pain at home with som relief. Pt went to urgent care but was sent here for further evaluation.  Patient is a 46 y.o. male presenting with leg pain.  Leg Pain  Pertinent negatives include no numbness.    History reviewed. No pertinent past medical history.  Past Surgical History  Procedure Date  . Hernia repair     Family History  Problem Relation Age of Onset  . Heart failure Father     History  Substance Use Topics  . Smoking status: Current Every Day Smoker -- 0.5 packs/day    Types: Cigarettes  . Smokeless tobacco: Not on file  . Alcohol Use: No      Review of Systems  Constitutional: Negative for fever and chills.  HENT: Negative for neck pain and neck stiffness.   Respiratory: Negative for cough, chest tightness, shortness of breath and wheezing.   Cardiovascular:  Positive for chest pain. Negative for palpitations and leg swelling.  Gastrointestinal: Negative for nausea, vomiting and abdominal pain.  Genitourinary: Negative for flank pain.  Musculoskeletal: Positive for myalgias and arthralgias.  Skin: Negative.   Neurological: Negative for dizziness and numbness.    Allergies  Review of patient's allergies indicates no known allergies.  Home Medications   Current Outpatient Rx  Name Route Sig Dispense Refill  . IBUPROFEN 200 MG PO TABS Oral Take 200 mg by mouth every 6 (six) hours as needed. For pain    . OVER THE COUNTER MEDICATION Oral Take 1 tablet by mouth daily as needed. allergy      There were no vitals taken for this visit.  Physical Exam  Nursing note and vitals reviewed. Constitutional: He appears well-developed and well-nourished. No distress.  HENT:  Head: Normocephalic.  Eyes: Conjunctivae normal are normal.  Neck: Neck supple.  Cardiovascular: Normal rate, regular rhythm and normal heart sounds.   Pulmonary/Chest: Effort normal and breath sounds normal. No respiratory distress. He has no wheezes. He has no rales. He exhibits no tenderness.  Abdominal: Soft. Bowel sounds are normal. He exhibits no distension. There is no tenderness. There is no rebound.  Musculoskeletal:       No midline lumbar spine tenderness. No tenderness in right SI or buttocks. No tenderness over hamstring muscle. Mild tenderness in the left calf. Negative homan's sign. Pain  with left straight leg raise.   Neurological: He is alert.       2+ and equal patellar reflexes bilaterally. 5/5 LE strength. Good strength with dorsiflexion, plantarflexion of bilateral feet. Equal dorsal pedal pulses bilaterally  Skin: Skin is warm and dry.  Psychiatric: He has a normal mood and affect.    ED Course  Procedures (including critical care time)  Pt with pressure in right side of the chest and sciatica like symptoms in the left leg. There is no swelling in left  leg, no signs of dvt. Will get labs, d dimer, cxr done at [facility] and showed a trace pleural effusion on the right side with no acute findings.   Results for orders placed during the hospital encounter of 11/12/11  CBC WITH DIFFERENTIAL      Component Value Range   WBC 6.8  4.0 - 10.5 K/uL   RBC 3.76 (*) 4.22 - 5.81 MIL/uL   Hemoglobin 12.0 (*) 13.0 - 17.0 g/dL   HCT 16.1 (*) 09.6 - 04.5 %   MCV 91.5  78.0 - 100.0 fL   MCH 31.9  26.0 - 34.0 pg   MCHC 34.9  30.0 - 36.0 g/dL   RDW 40.9  81.1 - 91.4 %   Platelets 185  150 - 400 K/uL   Neutrophils Relative 42 (*) 43 - 77 %   Neutro Abs 2.8  1.7 - 7.7 K/uL   Lymphocytes Relative 49 (*) 12 - 46 %   Lymphs Abs 3.3  0.7 - 4.0 K/uL   Monocytes Relative 5  3 - 12 %   Monocytes Absolute 0.3  0.1 - 1.0 K/uL   Eosinophils Relative 4  0 - 5 %   Eosinophils Absolute 0.2  0.0 - 0.7 K/uL   Basophils Relative 1  0 - 1 %   Basophils Absolute 0.1  0.0 - 0.1 K/uL  D-DIMER, QUANTITATIVE      Component Value Range   D-Dimer, Quant <0.27  0.00 - 0.48 ug/mL-FEU  POCT I-STAT, CHEM 8      Component Value Range   Sodium 138  135 - 145 mEq/L   Potassium 4.5  3.5 - 5.1 mEq/L   Chloride 102  96 - 112 mEq/L   BUN 13  6 - 23 mg/dL   Creatinine, Ser 7.82 (*) 0.50 - 1.35 mg/dL   Glucose, Bld 85  70 - 99 mg/dL   Calcium, Ion 9.56 (*) 1.12 - 1.23 mmol/L   TCO2 27  0 - 100 mmol/L   Hemoglobin 12.9 (*) 13.0 - 17.0 g/dL   HCT 21.3 (*) 08.6 - 57.8 %   Dg Chest 2 View  11/12/2011  *RADIOLOGY REPORT*  Clinical Data: Chest pain, leg pain  CHEST - 2 VIEW  Comparison: 11/25/2010  Findings: Cardiomediastinal silhouette is stable.  No acute infiltrate or pulmonary edema.  Bony thorax is stable.  Trace residual right pleural effusion or pleural thickening with blunting of the right costophrenic angle.  IMPRESSION: No acute infiltrate or pulmonary edema.  Bony thorax is stable. Trace residual right pleural effusion or pleural thickening with blunting of the right costophrenic  angle.   Original Report Authenticated By: Natasha Mead, M.D.     4:19 PM D dimer negative. Labs unremarkable other than elevated creatinine of 1.5, will follow up with pcp. Pt in no distress. Vs normal. Will treat symptomatically with follow up as needed. Pt is neurovascularly intact. No numbness or weakness in extremities, no sob.   VS:  temp 97.3, HR72, reps 16, BP 135/90, SpO2 98 taken from the monitor (non in the chart documented by nursing.)  1. Pleurisy   2. Pleurisy with effusion   3. Sciatica, left    MDM          Lottie Mussel, PA 11/12/11 1622

## 2011-11-12 NOTE — ED Notes (Signed)
Pt from [facility] c/o left upper leg pain like a muscle cramp and right sided rib pain x 6 months with deep breath; pt sent here for further eval

## 2011-11-12 NOTE — ED Notes (Signed)
Pt reports that his left leg has been hurting for the past 3 weeks with no known injury, pt also reports right side rib pain when he takes a deep breath for the past 6 months. No nausea, vomiting, diarrhea or radiating pain.

## 2011-11-13 NOTE — ED Provider Notes (Signed)
Medical screening examination/treatment/procedure(s) were performed by non-physician practitioner and as supervising physician I was immediately available for consultation/collaboration.  Jones Skene, M.D.     Jones Skene, MD 11/13/11 1300

## 2011-12-07 ENCOUNTER — Emergency Department (HOSPITAL_COMMUNITY)
Admission: EM | Admit: 2011-12-07 | Discharge: 2011-12-07 | Discharge status: Against Medical Advice | Payer: Self-pay | Attending: Emergency Medicine | Admitting: Emergency Medicine

## 2011-12-07 ENCOUNTER — Encounter (HOSPITAL_COMMUNITY): Payer: Self-pay | Admitting: Emergency Medicine

## 2011-12-07 DIAGNOSIS — M79606 Pain in leg, unspecified: Secondary | ICD-10-CM

## 2011-12-07 DIAGNOSIS — M79609 Pain in unspecified limb: Secondary | ICD-10-CM | POA: Insufficient documentation

## 2011-12-07 MED ORDER — OXYCODONE-ACETAMINOPHEN 5-325 MG PO TABS: 1.0000 | ORAL_TABLET | Freq: Once | ORAL | Status: DC

## 2011-12-07 MED ORDER — ACETAMINOPHEN 325 MG PO TABS: 650.0000 mg | ORAL_TABLET | Freq: Once | ORAL | Status: DC

## 2011-12-07 NOTE — ED Notes (Signed)
Per pt, was treated for same symptoms on October 1st, was negative for blood clot-medication did not help, pain continues-did not follow up with anyone

## 2011-12-17 ENCOUNTER — Ambulatory Visit (INDEPENDENT_AMBULATORY_CARE_PROVIDER_SITE_OTHER): Payer: Self-pay | Admitting: Family Medicine

## 2011-12-17 ENCOUNTER — Encounter: Payer: Self-pay | Admitting: Family Medicine

## 2011-12-17 VITALS — BP 143/86 | HR 65 | Ht 72.0 in | Wt 209.0 lb

## 2011-12-17 DIAGNOSIS — R0789 Other chest pain: Secondary | ICD-10-CM | POA: Insufficient documentation

## 2011-12-17 DIAGNOSIS — M5430 Sciatica, unspecified side: Secondary | ICD-10-CM

## 2011-12-17 DIAGNOSIS — R7989 Other specified abnormal findings of blood chemistry: Secondary | ICD-10-CM

## 2011-12-17 DIAGNOSIS — M5432 Sciatica, left side: Secondary | ICD-10-CM | POA: Insufficient documentation

## 2011-12-17 DIAGNOSIS — R799 Abnormal finding of blood chemistry, unspecified: Secondary | ICD-10-CM

## 2011-12-17 MED ORDER — GABAPENTIN 100 MG PO CAPS: 100.0000 mg | ORAL_CAPSULE | Freq: Three times a day (TID) | ORAL | Status: DC

## 2011-12-17 NOTE — Assessment & Plan Note (Addendum)
Patient with complaint of tightness in right chest following PNA 1 year ago. Question of whether this is related to residual effects of PNA or some other process.  Had CXR in early October that revealed residual right pleural effusion, which is somewhat surprising in a young male with no other medical issues.  He has no other complaints that could direct [place] towards a specific diagnosis. Plan: will continue to follow this tightness in chest and observe for any concerning symptoms, such as weight loss or night sweats.

## 2011-12-17 NOTE — Assessment & Plan Note (Signed)
Found to be elevated to 1.5 in ED in early October.  Trending up from 1.31 in 2011. Plan: patient to obtain orange card.  Will obtain UA and BMP once he has done this. Also plan for full physical at that time. Is to follow-up in 3 weeks for this.

## 2011-12-17 NOTE — Progress Notes (Signed)
  Subjective:    Patient ID: Curtis Barr, male    DOB: [birth year], 46 y.o.   MRN: [id number]  HPI Curtis Barr is a 46 yo male who presents for initial visit with PCP.  Left leg pain: began 3 months ago.  Occurs in the left leg.  Mostly when standing. Sharp shooting pain down the back of his left leg.  Denies loss of sensation, bowel or bladder dysfunction, and loss of strength. Denies back pain. Was seen in the ED in early October for this issue and was worked up for DVT with negative D-dimer.  Was given hydrocodone and flexeril, neither helped the pain. Also tried ibuprofen without relief.  Chest tightness: states this has been a chronic issue.  Had PNA 1 year ago and has had this right sided chest tightness since then. States only feels tight with deep inspiration, does not bother him otherwise.  Denies chest pain, shortness of breath, pain with exertion.  Denies weight loss and night sweats. Notably, had a CXR in early October that revealed trace residual right pleural effusion with no other abnormalities.  Elevated creatinine: noted on BMP in ED to be 1.5. Has been trending up from 1.31 in 2011. Patient was unaware of this.    Review of Systems negative except per HPI     Objective:   Physical Exam  Constitutional: He appears well-developed and well-nourished.  Cardiovascular: Normal rate, regular rhythm, normal heart sounds and intact distal pulses.   Pulmonary/Chest: Effort normal and breath sounds normal. He has no wheezes. He has no rales.  Musculoskeletal: He exhibits no edema.  Neurological:       Lower extremity exam: 5/5 strength throughout, sensation to light touch intact, normal patellar reflex, positive left straight leg raise    BP 143/86  Pulse 65  Ht 6' (1.829 m)  Wt 209 lb (94.802 kg)  BMI 28.35 kg/m2       Assessment & Plan:

## 2011-12-17 NOTE — Assessment & Plan Note (Signed)
Patient with shooting pain down left leg exacerbated by certain positions and with positive straight leg raise consistent with lumbar nerve impingement and sciatica.  Has tried variety of pain medications without relief. Plan: start on gabapentin 100 mg TID today with step in therapy every 3 days to goal of 200 mg TID. Will follow up on this issue at next appointment in 3 weeks.

## 2011-12-17 NOTE — Patient Instructions (Signed)
Nice to meet you today. Your leg pain is likely due to sciatica.  I will prescribe a medicine called Gabapentin that should help with this. We will continue to follow your chest tightness. I will see you back in 3 weeks for a full physical and lab work once you get the orange card. Thanks for your visit.

## 2012-01-14 ENCOUNTER — Encounter: Payer: Self-pay | Admitting: Family Medicine

## 2012-01-23 ENCOUNTER — Telehealth: Payer: Self-pay | Admitting: Family Medicine

## 2012-01-23 DIAGNOSIS — M5432 Sciatica, left side: Secondary | ICD-10-CM

## 2012-01-23 MED ORDER — GABAPENTIN 300 MG PO CAPS: 300.0000 mg | ORAL_CAPSULE | Freq: Three times a day (TID) | ORAL | Status: AC

## 2012-01-23 NOTE — Telephone Encounter (Signed)
Pt informed and agreeable. Curtis Barr Dawn  

## 2012-01-23 NOTE — Telephone Encounter (Signed)
Pt is still in pain and is asking for something stronger.  Cannot come in until they see Daisy Floro- [address]

## 2012-01-23 NOTE — Telephone Encounter (Signed)
Discussed with Dr. [place] and determined that he is on a low dose of gabapentin that would not resolve all of his pain. We think going up on his medication to 300 mg three times per day will be a start to resolving his pain. We can continue to titrate this up if this dose does not work. Please let the patient know I have sent in a new prescription for the patient to reflect this change.

## 2012-02-07 ENCOUNTER — Encounter: Payer: Self-pay | Admitting: Family Medicine

## 2012-03-04 ENCOUNTER — Encounter: Payer: Self-pay | Admitting: Family Medicine

## 2012-03-20 ENCOUNTER — Encounter: Payer: Self-pay | Admitting: Family Medicine

## 2012-04-01 ENCOUNTER — Encounter: Payer: Self-pay | Admitting: Family Medicine

## 2012-05-15 ENCOUNTER — Other Ambulatory Visit (HOSPITAL_COMMUNITY): Payer: Self-pay | Admitting: Family Medicine

## 2012-05-15 DIAGNOSIS — M79605 Pain in left leg: Secondary | ICD-10-CM

## 2012-05-15 DIAGNOSIS — M25552 Pain in left hip: Secondary | ICD-10-CM

## 2012-05-19 ENCOUNTER — Ambulatory Visit (HOSPITAL_COMMUNITY)
Admission: RE | Admit: 2012-05-19 | Discharge: 2012-05-19 | Disposition: A | Discharge status: Home / Self Care | Payer: No Typology Code available for payment source | Source: Ambulatory Visit | Attending: Family Medicine | Admitting: Family Medicine

## 2012-05-19 ENCOUNTER — Encounter (HOSPITAL_COMMUNITY): Payer: Self-pay

## 2012-05-19 DIAGNOSIS — M79605 Pain in left leg: Secondary | ICD-10-CM

## 2012-05-19 DIAGNOSIS — M25552 Pain in left hip: Secondary | ICD-10-CM

## 2012-05-19 MED ORDER — GADOBENATE DIMEGLUMINE 529 MG/ML IV SOLN
20.0000 mL | Freq: Once | INTRAVENOUS | Status: AC
  Administered 2012-05-19: 20 mL via INTRAVENOUS

## 2012-06-02 ENCOUNTER — Ambulatory Visit (HOSPITAL_COMMUNITY)
Admission: RE | Admit: 2012-06-02 | Discharge: 2012-06-02 | Disposition: A | Discharge status: Home / Self Care | Payer: No Typology Code available for payment source | Source: Ambulatory Visit | Attending: Family Medicine | Admitting: Family Medicine

## 2012-06-02 DIAGNOSIS — M545 Low back pain, unspecified: Secondary | ICD-10-CM | POA: Insufficient documentation

## 2012-06-02 DIAGNOSIS — M25559 Pain in unspecified hip: Secondary | ICD-10-CM | POA: Insufficient documentation

## 2012-06-02 DIAGNOSIS — M79609 Pain in unspecified limb: Secondary | ICD-10-CM | POA: Insufficient documentation

## 2012-06-02 DIAGNOSIS — M48061 Spinal stenosis, lumbar region without neurogenic claudication: Secondary | ICD-10-CM | POA: Insufficient documentation

## 2012-06-02 DIAGNOSIS — M5126 Other intervertebral disc displacement, lumbar region: Secondary | ICD-10-CM | POA: Insufficient documentation

## 2012-06-02 MED ORDER — GADOBENATE DIMEGLUMINE 529 MG/ML IV SOLN
20.0000 mL | Freq: Once | INTRAVENOUS | Status: AC | PRN
  Administered 2012-06-02: 20 mL via INTRAVENOUS

## 2012-10-27 ENCOUNTER — Ambulatory Visit: Payer: Self-pay | Admitting: Family Medicine

## 2012-11-12 ENCOUNTER — Ambulatory Visit: Payer: Self-pay | Admitting: Family Medicine

## 2012-11-26 ENCOUNTER — Ambulatory Visit: Payer: Self-pay | Admitting: Family Medicine

## 2014-03-24 ENCOUNTER — Encounter: Payer: Self-pay | Admitting: Obstetrics and Gynecology

## 2014-04-07 ENCOUNTER — Encounter: Payer: Self-pay | Admitting: Family Medicine

## 2015-12-13 ENCOUNTER — Emergency Department (HOSPITAL_COMMUNITY): Payer: No Typology Code available for payment source

## 2015-12-13 ENCOUNTER — Encounter (HOSPITAL_COMMUNITY): Payer: Self-pay

## 2015-12-13 ENCOUNTER — Emergency Department (HOSPITAL_COMMUNITY)
Admission: EM | Admit: 2015-12-13 | Discharge: 2015-12-13 | Disposition: A | Discharge status: Home / Self Care | Payer: No Typology Code available for payment source | Attending: Emergency Medicine | Admitting: Emergency Medicine

## 2015-12-13 DIAGNOSIS — Y939 Activity, unspecified: Secondary | ICD-10-CM | POA: Diagnosis not present

## 2015-12-13 DIAGNOSIS — Z87891 Personal history of nicotine dependence: Secondary | ICD-10-CM | POA: Diagnosis not present

## 2015-12-13 DIAGNOSIS — Y92410 Unspecified street and highway as the place of occurrence of the external cause: Secondary | ICD-10-CM | POA: Insufficient documentation

## 2015-12-13 DIAGNOSIS — S86912A Strain of unspecified muscle(s) and tendon(s) at lower leg level, left leg, initial encounter: Secondary | ICD-10-CM | POA: Diagnosis not present

## 2015-12-13 DIAGNOSIS — R0789 Other chest pain: Secondary | ICD-10-CM | POA: Insufficient documentation

## 2015-12-13 DIAGNOSIS — Y999 Unspecified external cause status: Secondary | ICD-10-CM | POA: Insufficient documentation

## 2015-12-13 DIAGNOSIS — S8992XA Unspecified injury of left lower leg, initial encounter: Secondary | ICD-10-CM | POA: Diagnosis present

## 2015-12-13 MED ORDER — CYCLOBENZAPRINE HCL 10 MG PO TABS: 10.0000 mg | ORAL_TABLET | Freq: Two times a day (BID) | ORAL | 0 refills | Status: AC | PRN

## 2015-12-13 MED ORDER — IBUPROFEN 800 MG PO TABS: 800.0000 mg | ORAL_TABLET | Freq: Three times a day (TID) | ORAL | 0 refills | Status: DC
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
## 2015-12-13 NOTE — ED Notes (Signed)
Patient to x-ray via wheel chair

## 2015-12-13 NOTE — ED Notes (Signed)
Patient back from radiology.
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
## 2017-07-24 ENCOUNTER — Emergency Department (HOSPITAL_COMMUNITY)
Admission: EM | Admit: 2017-07-24 | Discharge: 2017-07-24 | Disposition: A | Discharge status: Home / Self Care | Payer: Self-pay | Attending: Emergency Medicine | Admitting: Emergency Medicine

## 2017-07-24 ENCOUNTER — Other Ambulatory Visit: Payer: Self-pay

## 2017-07-24 ENCOUNTER — Encounter (HOSPITAL_COMMUNITY): Payer: Self-pay

## 2017-07-24 ENCOUNTER — Emergency Department (HOSPITAL_COMMUNITY): Payer: Self-pay

## 2017-07-24 DIAGNOSIS — E7800 Pure hypercholesterolemia, unspecified: Secondary | ICD-10-CM | POA: Insufficient documentation

## 2017-07-24 DIAGNOSIS — K273 Acute peptic ulcer, site unspecified, without hemorrhage or perforation: Secondary | ICD-10-CM | POA: Insufficient documentation

## 2017-07-24 DIAGNOSIS — Z87891 Personal history of nicotine dependence: Secondary | ICD-10-CM | POA: Insufficient documentation

## 2017-07-24 DIAGNOSIS — R7989 Other specified abnormal findings of blood chemistry: Secondary | ICD-10-CM | POA: Insufficient documentation

## 2017-07-24 DIAGNOSIS — K279 Peptic ulcer, site unspecified, unspecified as acute or chronic, without hemorrhage or perforation: Secondary | ICD-10-CM

## 2017-07-24 LAB — BASIC METABOLIC PANEL
Anion gap: 10 (ref 5–15)
BUN: 16 mg/dL (ref 6–20)
CALCIUM: 10.1 mg/dL (ref 8.9–10.3)
CHLORIDE: 103 mmol/L (ref 101–111)
CO2: 30 mmol/L (ref 22–32)
CREATININE: 1.3 mg/dL — AB (ref 0.61–1.24)
GFR calc non Af Amer: >60 mL/min (ref >60)
Glucose, Bld: 107 mg/dL — ABNORMAL HIGH (ref 65–99)
Potassium: 4.3 mmol/L (ref 3.5–5.1)
SODIUM: 143 mmol/L (ref 135–145)

## 2017-07-24 LAB — CBC
HCT: 38 % — ABNORMAL LOW (ref 39.0–52.0)
Hemoglobin: 12 g/dL — ABNORMAL LOW (ref 13.0–17.0)
MCH: 29.9 pg (ref 26.0–34.0)
MCHC: 31.6 g/dL (ref 30.0–36.0)
MCV: 94.5 fL (ref 78.0–100.0)
PLATELETS: 373 10*3/uL (ref 150–400)
RBC: 4.02 MIL/uL — AB (ref 4.22–5.81)
RDW: 15.5 % (ref 11.5–15.5)
WBC: 9.8 10*3/uL (ref 4.0–10.5)

## 2017-07-24 LAB — I-STAT TROPONIN, ED: Troponin i, poc: 0.01 ng/mL (ref 0.00–0.08)

## 2017-07-24 LAB — LIPASE, BLOOD: LIPASE: 26 U/L (ref 11–51)

## 2017-07-24 MED ORDER — PANTOPRAZOLE SODIUM 20 MG PO TBEC: 20.0000 mg | DELAYED_RELEASE_TABLET | Freq: Every day | ORAL | 0 refills | Status: DC

## 2017-07-24 MED ORDER — SUCRALFATE 1 GM/10ML PO SUSP: 1.0000 g | Freq: Two times a day (BID) | ORAL | 0 refills | Status: AC

## 2017-07-24 MED ORDER — GI COCKTAIL ~~LOC~~
30.0000 mL | Freq: Once | ORAL | Status: AC
  Administered 2017-07-24: 30 mL via ORAL
  Filled 2017-07-24: qty 30

## 2017-07-24 MED ORDER — ONDANSETRON 4 MG PO TBDP
4.0000 mg | ORAL_TABLET | Freq: Once | ORAL | Status: AC
  Administered 2017-07-24: 4 mg via ORAL
  Filled 2017-07-24: qty 1

## 2017-07-24 MED ORDER — SODIUM CHLORIDE 0.9 % IV BOLUS
1000.0000 mL | Freq: Once | INTRAVENOUS | Status: AC
  Administered 2017-07-24: 1000 mL via INTRAVENOUS

## 2017-07-24 NOTE — ED Provider Notes (Signed)
[facility]-EMERGENCY DEPT Provider Note  CSN: [id number] Arrival date & time: 07/24/17  1157   History   Chief Complaint Chief Complaint  Patient presents with  . Chest Pain  . Abdominal Pain    HPI Curtis Barr is a 52 y.o. male with a medical history of GERD and hypercholesterolemia who presented to the ED for epigastric pain x2 weeks. Patient describes intermittent, sharp epigastric pain. Unable to state if changes with eating or positions. Associated symptoms: N/V and SOB. Denies fever, changes in bowel or urinary habits, hematemesis, hematochezia/melena, diaphoresis and back pain.  Patient has tried Goody powder and antacids prior to coming to the ED with minimal relief. Patient admits to weekly alcohol and tobacco use.  Past Medical History:  Diagnosis Date  . Allergy    seasonal    Patient Active Problem List   Diagnosis Date Noted  . Sciatica of left side 12/17/2011  . Chest tightness 12/17/2011  . Creatinine elevation 12/17/2011  . THYROMEGALY 12/26/2009  . TOBACCO ABUSE 12/25/2009  . SORE THROAT 12/25/2009  . COSTOCHONDRITIS 09/09/2007  . HYPERCHOLESTEROLEMIA 11/25/2006  . GERD 11/25/2006  . BACK STRAIN, LUMBAR 11/11/2006  . HERNIA, HX OF 02/12/1999    Past Surgical History:  Procedure Laterality Date  . HERNIA REPAIR          Home Medications    Prior to Admission medications   Medication Sig Start Date End Date Taking? Authorizing Provider  aspirin-sod bicarb-citric acid (ALKA-SELTZER) 325 MG TBEF tablet Take 325 mg by mouth daily as needed (nausea and vomitng).   Yes [provider]  cyclobenzaprine (FLEXERIL) 10 MG tablet Take 1 tablet (10 mg total) by mouth 2 (two) times daily as needed for muscle spasms. Patient not taking: Reported on 07/24/2017 12/13/15   Teressa Lower, NP  gabapentin (NEURONTIN) 300 MG capsule Take 1 capsule (300 mg total) by mouth 3 (three) times daily. Patient not taking: Reported on  07/24/2017 01/23/12   Glori Luis, MD  ibuprofen (ADVIL,MOTRIN) 800 MG tablet Take 1 tablet (800 mg total) by mouth 3 (three) times daily. Patient not taking: Reported on 07/24/2017 12/13/15   Teressa Lower, NP    Family History Family History  Problem Relation Age of Onset  . Heart failure Father   . Hypertension Mother     Social History Social History   Tobacco Use  . Smoking status: Former Smoker    Packs/day: 0.50    Types: Cigarettes    Last attempt to quit: 11/16/2011    Years since quitting: 5.6  . Smokeless tobacco: Never Used  Substance Use Topics  . Alcohol use: No  . Drug use: No     Allergies   Patient has no known allergies.   Review of Systems Review of Systems  Constitutional: Negative for activity change, chills, diaphoresis, fatigue and fever.  Eyes: Negative for visual disturbance.  Respiratory: Negative for cough, chest tightness, shortness of breath and wheezing.   Cardiovascular: Negative for chest pain, palpitations and leg swelling.  Gastrointestinal: Positive for abdominal pain, nausea and vomiting. Negative for blood in stool, constipation and diarrhea.  Endocrine: Negative.   Genitourinary: Negative for dysuria.  Skin: Negative.   Neurological: Negative for dizziness, syncope, light-headedness, numbness and headaches.  Hematological: Negative.      Physical Exam Updated Vital Signs BP (!) 131/94 (BP Location: Right Arm)   Pulse 61   Temp 98.3 F (36.8 C) (Oral)   Resp 10   Ht 6' (  1.829 m)   Wt 90.7 kg (200 lb)   SpO2 100%   BMI 27.12 kg/m   Physical Exam  Constitutional: He appears well-developed and well-nourished. He is cooperative. He does not have a sickly appearance.  HENT:  Head: Normocephalic and atraumatic.  Eyes: Pupils are equal, round, and reactive to light. Conjunctivae and EOM are normal.  Neck: Normal range of motion. Neck supple.  Cardiovascular: Normal rate, regular rhythm, normal heart sounds, intact  distal pulses and normal pulses. Exam reveals no friction rub.  No murmur heard. Pulmonary/Chest: Effort normal and breath sounds normal.  Abdominal: Soft. Normal appearance and bowel sounds are normal. There is tenderness in the epigastric area. There is no rigidity and no guarding.  Neurological: He is alert.  Skin: Skin is warm and dry. Capillary refill takes less than 2 seconds.  Psychiatric: He has a normal mood and affect. His behavior is normal.  Nursing note and vitals reviewed.    ED Treatments / Results  Labs (all labs ordered are listed, but only abnormal results are displayed) Labs Reviewed  BASIC METABOLIC PANEL - Abnormal; Notable for the following components:      Result Value   Glucose, Bld 107 (*)    Creatinine, Ser 1.30 (*)    All other components within normal limits  CBC - Abnormal; Notable for the following components:   RBC 4.02 (*)    Hemoglobin 12.0 (*)    HCT 38.0 (*)    All other components within normal limits  LIPASE, BLOOD  I-STAT TROPONIN, ED    EKG None  Radiology Dg Chest 2 View  Result Date: 07/24/2017 CLINICAL DATA:  Chest and epigastric pain. EXAM: CHEST - 2 VIEW COMPARISON:  12/13/2015 FINDINGS: The heart size and mediastinal contours are within normal limits. Both lungs are clear. The visualized skeletal structures are unremarkable. IMPRESSION: No active cardiopulmonary disease. Electronically Signed   By: Signa Kell M.D.   On: 07/24/2017 12:51    Procedures Procedures (including critical care time)  Medications Ordered in ED Medications  gi cocktail (Maalox,Lidocaine,Donnatal) (30 mLs Oral Given 07/24/17 1359)  sodium chloride 0.9 % bolus 1,000 mL (1,000 mLs Intravenous New Bag/Given 07/24/17 1334)  ondansetron (ZOFRAN-ODT) disintegrating tablet 4 mg (4 mg Oral Given 07/24/17 1358)     Initial Impression / Assessment and Plan / ED Course  Triage vital signs and the nursing notes have been reviewed.  Pertinent labs & imaging  results that were available during care of the patient were reviewed and considered in medical decision making (see chart for details). Clinical Course as of Jul 25 1446  Thu Jul 24, 2017  1302 Negative troponin. EKG NSR. No ST elevations/depressions or acute infarct or ischemia seen. Useful in ruling out possible acute coronary event. CXR also reaffirming as it is normal. No vascular congestion, consolidation or other cardiopulm abnormalities seen.   [GM]  1355 GI cocktail ordered to assist with abdominal pain and 1L NS bolus for hydration.   [GM]  1441 Creatinine elevated at 1.30, but per medical chart review pt has slightly elevated creatinine at baseline. Patient does not have any symptoms that would suggest a renal process that requires additional evaluation today. Lipase normal which assists in ruling out pancreatitis.   [GM]    Clinical Course User Index [GM] Mortis, Sharyon Medicus, PA-C   Patient presents symptomatic with abdominal pain and vomited in the ED. Patient's labs and physical exam are unremarkable and reassuring that there is not an acute  cardio, pulm or abdominal process occurring. Given pt's history and clinical presentation, abdominal imaging is not warranted today. Symptoms likely attributed to PUD. No signs of acute GI bleed that warrant additional evaluation. Relief in the ED achieved with Zofran and GI cocktail. Education provided on PUD. OTC, supportive and lifestyle modifications were discussed as well.   Final Clinical Impressions(s) / ED Diagnoses  1. Peptic Ulcer Disease. Carafate and Protonix prescribed. Education provided and advised to follow-up with PCP.   2. Elevated Creatinine. At baseline it appears to be elevated. Advised to follow-up with PCP for annual blood work to monitor this.  Dispo: Home. After thorough clinical evaluation, this patient is determined to be medically stable and can be safely discharged with the previously mentioned treatment and/or  outpatient follow-up/referral(s). At this time, there are no other apparent medical conditions that require further screening, evaluation or treatment.  Final diagnoses:  None    ED Discharge Orders    None        Reva BoresMortis, Gabrielle I, PA-C 07/24/17 1452    Donnetta Hutchingook, Brian, MD 07/25/17 [phone]

## 2017-07-24 NOTE — ED Notes (Signed)
Lab called to add Lipase on to existing specimen.

## 2017-07-24 NOTE — ED Notes (Signed)
Pt vomited in floor. Pt requesting something for nausea before taking GI cocktail

## 2017-07-24 NOTE — ED Triage Notes (Signed)
Patient c/o constant epigastric, mid chest pain and nausea x 1 week. Patient denies any SOB, or diaphoresis.

## 2017-07-24 NOTE — Discharge Instructions (Signed)
Reasons to return to the ED: blood in the stool, worsening abdominal pain, bleeding from other body sites, dizziness/lightheadedness.  Follow-up with PCP in 1 month or sooner to establish care and discuss maintenance medication for GERD and ulcer prevention.

## 2017-12-03 ENCOUNTER — Emergency Department (HOSPITAL_COMMUNITY): Payer: Self-pay

## 2017-12-03 ENCOUNTER — Encounter (HOSPITAL_COMMUNITY): Payer: Self-pay | Admitting: Emergency Medicine

## 2017-12-03 ENCOUNTER — Other Ambulatory Visit: Payer: Self-pay

## 2017-12-03 ENCOUNTER — Emergency Department (HOSPITAL_COMMUNITY)
Admission: EM | Admit: 2017-12-03 | Discharge: 2017-12-03 | Disposition: A | Discharge status: Home / Self Care | Payer: Self-pay | Attending: Emergency Medicine | Admitting: Emergency Medicine

## 2017-12-03 DIAGNOSIS — R0789 Other chest pain: Secondary | ICD-10-CM | POA: Insufficient documentation

## 2017-12-03 DIAGNOSIS — Z87891 Personal history of nicotine dependence: Secondary | ICD-10-CM | POA: Insufficient documentation

## 2017-12-03 DIAGNOSIS — Z79899 Other long term (current) drug therapy: Secondary | ICD-10-CM | POA: Insufficient documentation

## 2017-12-03 DIAGNOSIS — R030 Elevated blood-pressure reading, without diagnosis of hypertension: Secondary | ICD-10-CM | POA: Insufficient documentation

## 2017-12-03 DIAGNOSIS — K219 Gastro-esophageal reflux disease without esophagitis: Secondary | ICD-10-CM | POA: Insufficient documentation

## 2017-12-03 LAB — CBC
HCT: 39 % (ref 39.0–52.0)
HEMOGLOBIN: 12.5 g/dL — AB (ref 13.0–17.0)
MCH: 29.3 pg (ref 26.0–34.0)
MCHC: 32.1 g/dL (ref 30.0–36.0)
MCV: 91.3 fL (ref 80.0–100.0)
Platelets: 234 10*3/uL (ref 150–400)
RBC: 4.27 MIL/uL (ref 4.22–5.81)
RDW: 19.2 % — AB (ref 11.5–15.5)
WBC: 5.6 10*3/uL (ref 4.0–10.5)
nRBC: 0 % (ref 0.0–0.2)

## 2017-12-03 LAB — COMPREHENSIVE METABOLIC PANEL
ALT: 15 U/L (ref 0–44)
AST: 38 U/L (ref 15–41)
Albumin: 4.6 g/dL (ref 3.5–5.0)
Alkaline Phosphatase: 48 U/L (ref 38–126)
Anion gap: 11 (ref 5–15)
BILIRUBIN TOTAL: 1 mg/dL (ref 0.3–1.2)
BUN: 15 mg/dL (ref 6–20)
CO2: 28 mmol/L (ref 22–32)
CREATININE: 1.13 mg/dL (ref 0.61–1.24)
Calcium: 9.3 mg/dL (ref 8.9–10.3)
Chloride: 97 mmol/L — ABNORMAL LOW (ref 98–111)
Glucose, Bld: 96 mg/dL (ref 70–99)
POTASSIUM: 3.5 mmol/L (ref 3.5–5.1)
Sodium: 136 mmol/L (ref 135–145)
TOTAL PROTEIN: 8.4 g/dL — AB (ref 6.5–8.1)

## 2017-12-03 LAB — I-STAT TROPONIN, ED: TROPONIN I, POC: 0 ng/mL (ref 0.00–0.08)

## 2017-12-03 MED ORDER — PANTOPRAZOLE SODIUM 20 MG PO TBEC: 20.0000 mg | DELAYED_RELEASE_TABLET | Freq: Every day | ORAL | 0 refills | Status: AC

## 2017-12-03 MED ORDER — ALUM & MAG HYDROXIDE-SIMETH 200-200-20 MG/5ML PO SUSP
30.0000 mL | Freq: Once | ORAL | Status: AC
  Administered 2017-12-03: 30 mL via ORAL
  Filled 2017-12-03: qty 30

## 2017-12-03 MED ORDER — LIDOCAINE VISCOUS HCL 2 % MT SOLN
15.0000 mL | Freq: Once | OROMUCOSAL | Status: AC
  Administered 2017-12-03: 15 mL via ORAL
  Filled 2017-12-03: qty 15

## 2017-12-03 MED ORDER — FAMOTIDINE IN NACL 20-0.9 MG/50ML-% IV SOLN
20.0000 mg | Freq: Once | INTRAVENOUS | Status: AC
  Administered 2017-12-03: 20 mg via INTRAVENOUS
  Filled 2017-12-03: qty 50

## 2017-12-03 NOTE — ED Triage Notes (Signed)
Pt complaint of left chest burning, constant in nature, onset Monday; denies other pain/symptoms.

## 2017-12-03 NOTE — ED Provider Notes (Signed)
[facility]-EMERGENCY DEPT Provider Note   CSN: [id number] Arrival date & time: 12/03/17  0749     History   Chief Complaint Chief Complaint  Patient presents with  . Chest Pain    HPI Curtis Barr is a 52 y.o. male.  The history is provided by the patient. No language interpreter was used.  Chest Pain     Curtis Barr is a 52 y.o. male who presents to the Emergency Department complaining of chest pain. He presents to the emergency department for evaluation of four days of epigastric/lower central chest pain described as a constant burning pain. He has partial relief when he takes Tums. He denies any fevers, cough, shortness of breath, abdominal pain, nausea, vomiting, diarrhea, leg swelling or pain. He has no known medical problems. He does drink half a pint of liquor three times a week. He also smokes cigarettes. No drug use. Past Medical History:  Diagnosis Date  . Allergy    seasonal    Patient Active Problem List   Diagnosis Date Noted  . Sciatica of left side 12/17/2011  . Chest tightness 12/17/2011  . Creatinine elevation 12/17/2011  . THYROMEGALY 12/26/2009  . TOBACCO ABUSE 12/25/2009  . SORE THROAT 12/25/2009  . COSTOCHONDRITIS 09/09/2007  . HYPERCHOLESTEROLEMIA 11/25/2006  . GERD 11/25/2006  . BACK STRAIN, LUMBAR 11/11/2006  . HERNIA, HX OF 02/12/1999    Past Surgical History:  Procedure Laterality Date  . HERNIA REPAIR          Home Medications    Prior to Admission medications   Medication Sig Start Date End Date Taking? Authorizing Provider  aspirin-sod bicarb-citric acid (ALKA-SELTZER) 325 MG TBEF tablet Take 325 mg by mouth daily as needed (nausea and vomitng).   Yes [provider]  calcium carbonate (TUMS - DOSED IN MG ELEMENTAL CALCIUM) 500 MG chewable tablet Chew 1,000 mg by mouth 2 (two) times daily as needed for indigestion or heartburn.   Yes [provider]  cyclobenzaprine  (FLEXERIL) 10 MG tablet Take 1 tablet (10 mg total) by mouth 2 (two) times daily as needed for muscle spasms. Patient not taking: Reported on 07/24/2017 12/13/15   Teressa Lower, NP  gabapentin (NEURONTIN) 300 MG capsule Take 1 capsule (300 mg total) by mouth 3 (three) times daily. Patient not taking: Reported on 07/24/2017 01/23/12   Glori Luis, MD  ibuprofen (ADVIL,MOTRIN) 800 MG tablet Take 1 tablet (800 mg total) by mouth 3 (three) times daily. Patient not taking: Reported on 07/24/2017 12/13/15   Teressa Lower, NP  pantoprazole (PROTONIX) 20 MG tablet Take 1 tablet (20 mg total) by mouth daily. 12/03/17 01/02/18  Tilden Fossa, MD  sucralfate (CARAFATE) 1 GM/10ML suspension Take 10 mLs (1 g total) by mouth 2 (two) times daily for 14 days. Patient not taking: Reported on 12/03/2017 07/24/17 08/07/17  Windy Carina, PA-C    Family History Family History  Problem Relation Age of Onset  . Heart failure Father   . Hypertension Mother     Social History Social History   Tobacco Use  . Smoking status: Former Smoker    Packs/day: 0.50    Types: Cigarettes    Last attempt to quit: 11/16/2011    Years since quitting: 6.0  . Smokeless tobacco: Never Used  Substance Use Topics  . Alcohol use: No  . Drug use: No     Allergies   Patient has no known allergies.   Review of Systems Review of  Systems  Cardiovascular: Positive for chest pain.  All other systems reviewed and are negative.    Physical Exam Updated Vital Signs BP (!) 160/104 (BP Location: Right Arm)   Pulse 73   Temp 98.6 F (37 C) (Oral)   Resp 14   SpO2 97%   Physical Exam  Constitutional: He is oriented to person, place, and time. He appears well-developed and well-nourished.  HENT:  Head: Normocephalic and atraumatic.  Cardiovascular: Normal rate and regular rhythm.  No murmur heard. Pulmonary/Chest: Effort normal and breath sounds normal. No respiratory distress.  Abdominal: Soft.  There is no tenderness. There is no rebound and no guarding.  Musculoskeletal: He exhibits no edema or tenderness.  Neurological: He is alert and oriented to person, place, and time.  Skin: Skin is warm and dry.  Psychiatric: He has a normal mood and affect. His behavior is normal.  Nursing note and vitals reviewed.    ED Treatments / Results  Labs (all labs ordered are listed, but only abnormal results are displayed) Labs Reviewed  CBC - Abnormal; Notable for the following components:      Result Value   Hemoglobin 12.5 (*)    RDW 19.2 (*)    All other components within normal limits  COMPREHENSIVE METABOLIC PANEL - Abnormal; Notable for the following components:   Chloride 97 (*)    Total Protein 8.4 (*)    All other components within normal limits  I-STAT TROPONIN, ED    EKG EKG Interpretation  Date/Time:  Wednesday December 03 2017 07:57:07 EDT Ventricular Rate:  75 PR Interval:    QRS Duration: 96 QT Interval:  426 QTC Calculation: 476 R Axis:   85 Text Interpretation:  Sinus rhythm Borderline low voltage, extremity leads Nonspecific T abnormalities, lateral leads Borderline prolonged QT interval Baseline wander in lead(s) V5 V6 No significant change since last tracing Confirmed by Tilden Fossa [phone]) on 12/03/2017 8:03:55 AM   Radiology Dg Chest 2 View  Result Date: 12/03/2017 CLINICAL DATA:  Epigastric region pain. EXAM: CHEST - 2 VIEW COMPARISON:  July 24, 2017 FINDINGS: There is scarring in the lateral right base. Lungs elsewhere are clear. The heart size and pulmonary vascularity are normal. No adenopathy. No pneumothorax. No bone lesions. IMPRESSION: Scarring lateral right base. Lungs elsewhere clear. Stable cardiac silhouette. Electronically Signed   By: Bretta Bang III M.D.   On: 12/03/2017 08:46    Procedures Procedures (including critical care time)  Medications Ordered in ED Medications  alum & mag hydroxide-simeth (MAALOX/MYLANTA) 200-200-20  MG/5ML suspension 30 mL (30 mLs Oral Given 12/03/17 0839)    And  lidocaine (XYLOCAINE) 2 % viscous mouth solution 15 mL (15 mLs Oral Given 12/03/17 0839)  famotidine (PEPCID) IVPB 20 mg premix (20 mg Intravenous New Bag/Given 12/03/17 0840)     Initial Impression / Assessment and Plan / ED Course  I have reviewed the triage vital signs and the nursing notes.  Pertinent labs & imaging results that were available during my care of the patient were reviewed by me and considered in my medical decision making (see chart for details).     Patient here for evaluation of four days of constant burning chest pain. He had resolution of his symptoms following G.I. cocktail administration. Presentation is not consistent with ACS, PE, dissection, pancreatitis, cholecystitis, hypertensive urgency. Discussed with patient home care for reflux. Discussed with patient decreasing his alcohol intake as this may be a contributing factor. Patient does have elevated blood pressure in  the emergency department. Discussed following up with family doctor for recheck of his blood pressure.  Final Clinical Impressions(s) / ED Diagnoses   Final diagnoses:  Atypical chest pain  Gastroesophageal reflux disease without esophagitis  Elevated blood pressure reading in office without diagnosis of hypertension    ED Discharge Orders         Ordered    pantoprazole (PROTONIX) 20 MG tablet  Daily     12/03/17 0930           Tilden Fossa, MD 12/03/17 [phone]

## 2018-02-11 ENCOUNTER — Emergency Department (HOSPITAL_COMMUNITY)
Admission: EM | Admit: 2018-02-11 | Discharge: 2018-02-11 | Disposition: A | Discharge status: Home / Self Care | Payer: Self-pay | Attending: Emergency Medicine | Admitting: Emergency Medicine

## 2018-02-11 ENCOUNTER — Encounter (HOSPITAL_COMMUNITY): Payer: Self-pay | Admitting: Emergency Medicine

## 2018-02-11 ENCOUNTER — Emergency Department (HOSPITAL_BASED_OUTPATIENT_CLINIC_OR_DEPARTMENT_OTHER): Payer: Self-pay

## 2018-02-11 ENCOUNTER — Other Ambulatory Visit: Payer: Self-pay

## 2018-02-11 DIAGNOSIS — I824Z2 Acute embolism and thrombosis of unspecified deep veins of left distal lower extremity: Secondary | ICD-10-CM | POA: Insufficient documentation

## 2018-02-11 DIAGNOSIS — R52 Pain, unspecified: Secondary | ICD-10-CM

## 2018-02-11 DIAGNOSIS — R609 Edema, unspecified: Secondary | ICD-10-CM

## 2018-02-11 DIAGNOSIS — Z79899 Other long term (current) drug therapy: Secondary | ICD-10-CM | POA: Insufficient documentation

## 2018-02-11 DIAGNOSIS — Z87891 Personal history of nicotine dependence: Secondary | ICD-10-CM | POA: Insufficient documentation

## 2018-02-11 LAB — CBC WITH DIFFERENTIAL/PLATELET
ABS IMMATURE GRANULOCYTES: 0.01 10*3/uL (ref 0.00–0.07)
BASOS PCT: 1 %
Basophils Absolute: 0.1 10*3/uL (ref 0.0–0.1)
Eosinophils Absolute: 0.2 10*3/uL (ref 0.0–0.5)
Eosinophils Relative: 3 %
HCT: 37.8 % — ABNORMAL LOW (ref 39.0–52.0)
Hemoglobin: 12.2 g/dL — ABNORMAL LOW (ref 13.0–17.0)
IMMATURE GRANULOCYTES: 0 %
Lymphocytes Relative: 43 %
Lymphs Abs: 2.6 10*3/uL (ref 0.7–4.0)
MCH: 29.5 pg (ref 26.0–34.0)
MCHC: 32.3 g/dL (ref 30.0–36.0)
MCV: 91.5 fL (ref 80.0–100.0)
Monocytes Absolute: 0.4 10*3/uL (ref 0.1–1.0)
Monocytes Relative: 6 %
NEUTROS ABS: 2.9 10*3/uL (ref 1.7–7.7)
NEUTROS PCT: 47 %
NRBC: 0 % (ref 0.0–0.2)
PLATELETS: 298 10*3/uL (ref 150–400)
RBC: 4.13 MIL/uL — AB (ref 4.22–5.81)
RDW: 17.5 % — ABNORMAL HIGH (ref 11.5–15.5)
WBC: 6.1 10*3/uL (ref 4.0–10.5)

## 2018-02-11 LAB — BASIC METABOLIC PANEL
ANION GAP: 10 (ref 5–15)
BUN: 17 mg/dL (ref 6–20)
CALCIUM: 9.6 mg/dL (ref 8.9–10.3)
CO2: 27 mmol/L (ref 22–32)
Chloride: 99 mmol/L (ref 98–111)
Creatinine, Ser: 1.2 mg/dL (ref 0.61–1.24)
GFR calc Af Amer: >60 mL/min (ref >60)
Glucose, Bld: 87 mg/dL (ref 70–99)
POTASSIUM: 4.8 mmol/L (ref 3.5–5.1)
SODIUM: 136 mmol/L (ref 135–145)

## 2018-02-11 MED ORDER — RIVAROXABAN (XARELTO) VTE STARTER PACK (15 & 20 MG): ORAL_TABLET | ORAL | 0 refills | Status: DC

## 2018-02-11 MED ORDER — RIVAROXABAN (XARELTO) EDUCATION KIT FOR DVT/PE PATIENTS
PACK | Freq: Once | Status: AC
  Administered 2018-02-11: 18:00:00
  Filled 2018-02-11: qty 1

## 2018-02-11 MED ORDER — RIVAROXABAN 15 MG PO TABS
15.0000 mg | ORAL_TABLET | Freq: Once | ORAL | Status: AC
  Administered 2018-02-11: 15 mg via ORAL
  Filled 2018-02-11: qty 1

## 2018-02-11 NOTE — ED Provider Notes (Signed)
[facility] DEPT Provider Note   CSN: [id number] Arrival date & time: 02/11/18  0919     History   Chief Complaint Chief Complaint  Patient presents with  . Leg Pain    HPI Curtis Barr is a 53 y.o. male.  HPI  Patient is a 53 year old male with history of seasonal allergies who presents emergency department today complaining of pain in the left leg.  He reports diffuse left leg pain that he states is been present for the last 3 years.  He states he was seen for this in the past and had an MRI completed due to reported history of cancer.  States that over the past several days his leg pain has become worse.  He denies denies injury or trauma.  He states the pain is worse when he is walking up the stairs, when he stands too long, or when he does certain activities.  Denies any swelling to the leg. Reports some numbness to the anterior aspect of the shin that is intermittent.  States he has had sciatica in the past but this feels different.  He does not have any back pain.  Denies leg swelling, hemoptysis, CP, SOB, recent surgery/trauma, recent long travel, hormone use, personal hx of cancer, or hx of DVT/PE.   Past Medical History:  Diagnosis Date  . Allergy    seasonal    Patient Active Problem List   Diagnosis Date Noted  . Sciatica of left side 12/17/2011  . Chest tightness 12/17/2011  . Creatinine elevation 12/17/2011  . THYROMEGALY 12/26/2009  . TOBACCO ABUSE 12/25/2009  . SORE THROAT 12/25/2009  . COSTOCHONDRITIS 09/09/2007  . HYPERCHOLESTEROLEMIA 11/25/2006  . GERD 11/25/2006  . BACK STRAIN, LUMBAR 11/11/2006  . HERNIA, HX OF 02/12/1999    Past Surgical History:  Procedure Laterality Date  . HERNIA REPAIR          Home Medications    Prior to Admission medications   Medication Sig Start Date End Date Taking? Authorizing Provider  aspirin-sod bicarb-citric acid (ALKA-SELTZER) 325 MG TBEF tablet Take 325 mg by mouth  daily as needed (nausea and vomitng).    [provider]  calcium carbonate (TUMS - DOSED IN MG ELEMENTAL CALCIUM) 500 MG chewable tablet Chew 1,000 mg by mouth 2 (two) times daily as needed for indigestion or heartburn.    [provider]  cyclobenzaprine (FLEXERIL) 10 MG tablet Take 1 tablet (10 mg total) by mouth 2 (two) times daily as needed for muscle spasms. Patient not taking: Reported on 07/24/2017 12/13/15   Glendell Docker, NP  gabapentin (NEURONTIN) 300 MG capsule Take 1 capsule (300 mg total) by mouth 3 (three) times daily. Patient not taking: Reported on 07/24/2017 01/23/12   Leone Haven, MD  ibuprofen (ADVIL,MOTRIN) 800 MG tablet Take 1 tablet (800 mg total) by mouth 3 (three) times daily. Patient not taking: Reported on 07/24/2017 12/13/15   Glendell Docker, NP  pantoprazole (PROTONIX) 20 MG tablet Take 1 tablet (20 mg total) by mouth daily. 12/03/17 01/02/18  Quintella Reichert, MD  Rivaroxaban 15 & 20 MG TBPK Take as directed on package: Start with one 20m tablet by mouth twice a day with food. On Day 22, switch to one 293mtablet once a day with food. 02/11/18   Angelmarie Ponzo S, PA-C  sucralfate (CARAFATE) 1 GM/10ML suspension Take 10 mLs (1 g total) by mouth 2 (two) times daily for 14 days. Patient not taking: Reported on 12/03/2017 07/24/17 08/07/17  Mortis, Jonelle Sports, PA-C    Family History Family History  Problem Relation Age of Onset  . Heart failure Father   . Hypertension Mother     Social History Social History   Tobacco Use  . Smoking status: Former Smoker    Packs/day: 0.50    Types: Cigarettes    Last attempt to quit: 11/16/2011    Years since quitting: 6.2  . Smokeless tobacco: Never Used  Substance Use Topics  . Alcohol use: No  . Drug use: No     Allergies   Patient has no known allergies.   Review of Systems Review of Systems  Constitutional: Negative for fever.  Respiratory: Negative for shortness of breath.     Cardiovascular: Negative for chest pain.  Musculoskeletal: Negative for back pain.       Leg pain  Skin: Negative for color change.  Neurological: Positive for numbness. Negative for weakness.     Physical Exam Updated Vital Signs BP 132/88   Pulse 67   Temp 98.8 F (37.1 C) (Oral)   Resp 16   Ht 6' (1.829 m)   Wt 83 kg   SpO2 100%   BMI 24.81 kg/m   Physical Exam Vitals signs and nursing note reviewed.  Constitutional:      Appearance: He is well-developed.  HENT:     Head: Normocephalic and atraumatic.  Eyes:     Conjunctiva/sclera: Conjunctivae normal.  Neck:     Musculoskeletal: Neck supple.  Cardiovascular:     Rate and Rhythm: Normal rate and regular rhythm.  Pulmonary:     Effort: Pulmonary effort is normal.  Abdominal:     Palpations: Abdomen is soft.  Musculoskeletal:     Comments: 5/5 strength to BLE. No midline lumbar ttp or ttp to the sciatic notch. No reproducible ttp to the LLE. No swelling or edema. Normal color. DP pulses 2+ and symmetric. Brisk cap refill. Ambulatory without limp.   Skin:    General: Skin is warm and dry.  Neurological:     Mental Status: He is alert.      ED Treatments / Results  Labs (all labs ordered are listed, but only abnormal results are displayed) Labs Reviewed  CBC WITH DIFFERENTIAL/PLATELET - Abnormal; Notable for the following components:      Result Value   RBC 4.13 (*)    Hemoglobin 12.2 (*)    HCT 37.8 (*)    RDW 17.5 (*)    All other components within normal limits  BASIC METABOLIC PANEL    EKG None  Radiology Vas [place] Lower Extremity Venous (dvt) (mc And Wl 7a-7p)  Result Date: 02/11/2018  Lower Venous Study Indications: Pain, and Edema.  Limitations: Body habitus and musculoskeletal features. Comparison Study: No prior study available Performing Technologist: Rudell Cobb, H  Examination Guidelines: A complete evaluation includes B-mode imaging, spectral Doppler, color Doppler, and power Doppler as  needed of all accessible portions of each vessel. Bilateral testing is considered an integral part of a complete examination. Limited examinations for reoccurring indications may be performed as noted.  Right Venous Findings: +---+---------------+---------+-----------+----------+-------+    CompressibilityPhasicitySpontaneityPropertiesSummary +---+---------------+---------+-----------+----------+-------+ CFVFull           Yes      Yes                          +---+---------------+---------+-----------+----------+-------+  Left Venous Findings: +----+---------------+---------+-----------+----------+------------------------+     CompressibilityPhasicitySpontaneityPropertiesSummary                  +----+---------------+---------+-----------+----------+------------------------+  POP Partial                                      distal segment involved  +----+---------------+---------+-----------+----------+------------------------+ PTV                         Yes                  not well visualized with                                                  compression              +----+---------------+---------+-----------+----------+------------------------+ PERONone                                         Age Indeterminate        +----+---------------+---------+-----------+----------+------------------------+ SSV None                    No                                            +----+---------------+---------+-----------+----------+------------------------+    Summary: Right: No evidence of common femoral vein obstruction. Left: Findings consistent with acute deep vein thrombosis involving the left popliteal vein distal segment. Findings consistent with acute superficial vein thrombosis involving the left small saphenous vein. Findings consistent with age indeterminate deep vein thrombosis involving the left peroneal vein mid to distal segments. No cystic  structure found in the popliteal fossa.  *See table(s) above for measurements and observations.    Preliminary     Procedures Procedures (including critical care time)  Medications Ordered in ED Medications  rivaroxaban [facility] Reasons) Education Kit for DVT/PE patients ( Does not apply Given 02/11/18 1741)  Rivaroxaban (XARELTO) tablet 15 mg (15 mg Oral Given 02/11/18 1741)     Initial Impression / Assessment and Plan / ED Course  I have reviewed the triage vital signs and the nursing notes.  Pertinent labs & imaging results that were available during my care of the patient were reviewed by me and considered in my medical decision making (see chart for details).    Final Clinical Impressions(s) / ED Diagnoses   Final diagnoses:  Acute deep vein thrombosis (DVT) of distal vein of left lower extremity [facility])   Patient presented the ED today complaining of left lower leg pain that is chronic in nature but seems to be worse over the last several days.  Pain is worse when he stands on his leg for too long, goes up the stairs or does certain activities.  No injuries or trauma.  No swelling or discoloration to the left leg.  No risk factors for DVT.  On exam patient has normal range of motion.  He does not have any back pain causing a radiculopathy.  He has no edema or swelling.  He has no reproducible pain with palpation of the leg.  His pulses are strong distally and are symmetric.  He has brisk cap refill.  Will obtain  US to r/o DVT.  [place] with Findings consistent with acute deep vein thrombosis involving the left popliteal vein distal segment. Findings consistent with acute superficial vein thrombosis involving the left small saphenous vein. Findings consistent with age indeterminate deep vein thrombosis involving the left peroneal vein mid to distal segments. No cystic structure found in the popliteal fossa.  Labs drawn to check renal fxn. Cbc with stable anemia. No leukocytosis. BMP wnl. Normal  creatinine.   Will give first dose of xarelto. Xarelto starter pack ordered and pharmacy spoke with patient and educated him on med. Coupon given. Case mgmt consulted and placed info in AVS for f/u information. Pt advised on f/u process. Advised on importance of following up with pcp within the next 30 days. Return precautions discussed. He voices understanding of the plan and reason to return. All questions answered.    ED Discharge Orders         Ordered    Rivaroxaban 15 & 20 MG TBPK     02/11/18 [address] PA-C 02/11/18 1757    Dorie Rank, MD 02/14/18 1627

## 2018-02-11 NOTE — ED Notes (Signed)
Bed: WTR9 Expected date:  Expected time:  Means of arrival:  Comments: 

## 2018-02-11 NOTE — ED Triage Notes (Signed)
Pt complaint of left leg pain since last Friday when walking up stairs with his job; denies swelling or fever.

## 2018-02-11 NOTE — Discharge Instructions (Addendum)
Please follow up with your primary care provider within 5-7 days for re-evaluation of your symptoms. If you do not have a primary care provider, information for a healthcare clinic has been provided for you to make arrangements for follow up care.  You were also given a referral to follow-up with a vascular surgery team to further work-up your symptoms.  Please call the office to make an appointment for follow-up.  Please return to the emergency department for any new or worsening symptoms specifically if you develop chest pain, shortness of breath or any discoloration or increased pain to your left lower extremity.   Information on my medicine - XARELTO (rivaroxaban) WHY WAS XARELTO PRESCRIBED FOR YOU? Xarelto was prescribed to treat blood clots that may have been found in the veins of your legs (deep vein thrombosis) or in your lungs (pulmonary embolism) and to reduce the risk of them occurring again.  WHAT DO YOU NEED TO KNOW ABOUT XARELTO? The starting dose is one 15 mg tablet taken TWICE daily with food for the FIRST 21 DAYS then on 03/05/2018  the dose is changed to one 20 mg tablet taken ONCE A DAY with your evening meal.  DO NOT stop taking Xarelto without talking to the health care provider who prescribed the medication.  Refill your prescription for 20 mg tablets before you run out.  After discharge, you should have regular check-up appointments with your healthcare provider that is prescribing your Xarelto.  In the future your dose may need to be changed if your kidney function changes by a significant amount.  WHAT DO YOU DO IF YOU MISS A DOSE? If you are taking Xarelto TWICE DAILY and you miss a dose, take it as soon as you remember. You may take two 15 mg tablets (total 30 mg) at the same time then resume your regularly scheduled 15 mg twice daily the next day.  If you are taking Xarelto ONCE DAILY and you miss a dose, take it as soon as you remember on the same day then  continue your regularly scheduled once daily regimen the next day. Do not take two doses of Xarelto at the same time.   IMPORTANT SAFETY INFORMATION Xarelto is a blood thinner medicine that can cause bleeding. You should call your healthcare provider right away if you experience any of the following: Bleeding from an injury or your nose that does not stop. Unusual colored urine (red or dark brown) or unusual colored stools (red or black). Unusual bruising for unknown reasons. A serious fall or if you hit your head (even if there is no bleeding).  Some medicines may interact with Xarelto and might increase your risk of bleeding while on Xarelto. To help avoid this, consult your healthcare provider or pharmacist prior to using any new prescription or non-prescription medications, including herbals, vitamins, non-steroidal anti-inflammatory drugs (NSAIDs) and supplements.  This website has more information on Xarelto: VisitDestination.com.br.

## 2018-02-11 NOTE — Progress Notes (Signed)
Left lower extremity venous duplex exam completed. Positive Findings consistent with acute deep vein thrombosis involving the left popliteal vein distal segment, acute superficial vein thrombosis involving the left small saphenous vein, age indeterminate deep vein thrombosis involving the left peroneal vein mid to distal segments.  More details please see preliminary notes on CV PROC under chart review. Menna Abeln H Caydance Kuehnle(RDMS RVT) 02/11/18 3:11 PM

## 2018-02-11 NOTE — Care Management Note (Signed)
Case Management Note  CM consulted for discounted anticoagulation medication assistance. CM advised Couture, PA to consult pharmacy for the correct medication, dose, and discount card.  CM advised [facility] with financial counseling and pharmacy information will be on AVS for follow up.  No further CM needs noted at this time.  Jakalyn Kratky, Lynnae Sandhoff, RN 02/11/2018, 3:53 PM

## 2018-02-11 NOTE — Progress Notes (Signed)
Consult request has been received. CSW confirmed with RN CM pt's needs have been met by the RN CM.  Please reconsult if future social work needs arise.  CSW signing off, as social work intervention is no longer needed.  Alphonse Guild. Allanna Bresee, LCSW, LCAS, CSI Clinical Social Worker Ph: [phone]

## 2018-02-16 ENCOUNTER — Other Ambulatory Visit: Payer: Self-pay

## 2018-02-16 ENCOUNTER — Emergency Department (HOSPITAL_COMMUNITY): Payer: Self-pay

## 2018-02-16 ENCOUNTER — Encounter (HOSPITAL_COMMUNITY): Payer: Self-pay | Admitting: Emergency Medicine

## 2018-02-16 ENCOUNTER — Emergency Department (HOSPITAL_COMMUNITY)
Admission: EM | Admit: 2018-02-16 | Discharge: 2018-02-16 | Disposition: A | Discharge status: Home / Self Care | Payer: Self-pay | Attending: Emergency Medicine | Admitting: Emergency Medicine

## 2018-02-16 DIAGNOSIS — Z7982 Long term (current) use of aspirin: Secondary | ICD-10-CM | POA: Insufficient documentation

## 2018-02-16 DIAGNOSIS — R072 Precordial pain: Secondary | ICD-10-CM | POA: Insufficient documentation

## 2018-02-16 DIAGNOSIS — Z79899 Other long term (current) drug therapy: Secondary | ICD-10-CM | POA: Insufficient documentation

## 2018-02-16 DIAGNOSIS — Z87891 Personal history of nicotine dependence: Secondary | ICD-10-CM | POA: Insufficient documentation

## 2018-02-16 HISTORY — DX: Acute embolism and thrombosis of unspecified deep veins of unspecified lower extremity: I82.409

## 2018-02-16 LAB — BRAIN NATRIURETIC PEPTIDE: B NATRIURETIC PEPTIDE 5: 12.8 pg/mL (ref 0.0–100.0)

## 2018-02-16 LAB — BASIC METABOLIC PANEL
Anion gap: 9 (ref 5–15)
BUN: 12 mg/dL (ref 6–20)
CO2: 22 mmol/L (ref 22–32)
CREATININE: 1.25 mg/dL — AB (ref 0.61–1.24)
Calcium: 9.8 mg/dL (ref 8.9–10.3)
Chloride: 103 mmol/L (ref 98–111)
GFR calc non Af Amer: >60 mL/min (ref >60)
Glucose, Bld: 75 mg/dL (ref 70–99)
Potassium: 4 mmol/L (ref 3.5–5.1)
Sodium: 134 mmol/L — ABNORMAL LOW (ref 135–145)

## 2018-02-16 LAB — CBC WITH DIFFERENTIAL/PLATELET
Abs Immature Granulocytes: 0.01 10*3/uL (ref 0.00–0.07)
BASOS ABS: 0.1 10*3/uL (ref 0.0–0.1)
Basophils Relative: 1 %
EOS PCT: 2 %
Eosinophils Absolute: 0.2 10*3/uL (ref 0.0–0.5)
HCT: 36.2 % — ABNORMAL LOW (ref 39.0–52.0)
HEMOGLOBIN: 11.6 g/dL — AB (ref 13.0–17.0)
Immature Granulocytes: 0 %
LYMPHS ABS: 3.2 10*3/uL (ref 0.7–4.0)
LYMPHS PCT: 40 %
MCH: 29.1 pg (ref 26.0–34.0)
MCHC: 32 g/dL (ref 30.0–36.0)
MCV: 91 fL (ref 80.0–100.0)
MONO ABS: 0.5 10*3/uL (ref 0.1–1.0)
MONOS PCT: 6 %
Neutro Abs: 4 10*3/uL (ref 1.7–7.7)
Neutrophils Relative %: 51 %
Platelets: 279 10*3/uL (ref 150–400)
RBC: 3.98 MIL/uL — ABNORMAL LOW (ref 4.22–5.81)
RDW: 17.6 % — ABNORMAL HIGH (ref 11.5–15.5)
WBC: 7.9 10*3/uL (ref 4.0–10.5)
nRBC: 0 % (ref 0.0–0.2)

## 2018-02-16 LAB — TROPONIN I: Troponin I: <0.03 ng/mL (ref <0.03)

## 2018-02-16 LAB — I-STAT TROPONIN, ED: Troponin i, poc: 0 ng/mL (ref 0.00–0.08)

## 2018-02-16 MED ORDER — MORPHINE SULFATE (PF) 4 MG/ML IV SOLN
4.0000 mg | Freq: Once | INTRAVENOUS | Status: AC
  Administered 2018-02-16: 4 mg via INTRAVENOUS
  Filled 2018-02-16: qty 1

## 2018-02-16 MED ORDER — IOPAMIDOL (ISOVUE-370) INJECTION 76%
INTRAVENOUS | Status: AC
  Administered 2018-02-16: 100 mL
  Filled 2018-02-16: qty 100

## 2018-02-16 NOTE — ED Notes (Signed)
Declined W/C at D/C and was escorted to lobby by RN. 

## 2018-02-16 NOTE — ED Provider Notes (Signed)
[facility] EMERGENCY DEPARTMENT Provider Note   CSN: [id number] Arrival date & time: 02/16/18  [id number]     History   Chief Complaint Chief Complaint  Patient presents with  . Chest Pain    HPI Evalyn CascoChristopher D Fusaro is a 53 y.o. male.  HPI Patient is a 53 year old male with a history of recently diagnosed left lower extremity DVT who presents the emergency department complaints of left low back pain as well as sharp anterior chest pain without significant shortness of breath.  He reports the chest discomfort began this morning.  No history of pulmonary embolism.  Pain is anterior in his chest without significant radiation.  No dysuria or urinary frequency.  Denies abdominal pain.  No recent injury or trauma.  No recent heavy lifting.  Reports pain in his low back for him.     Past Medical History:  Diagnosis Date  . Allergy    seasonal  . DVT (deep venous thrombosis) [facility](HCC)     Patient Active Problem List   Diagnosis Date Noted  . Sciatica of left side 12/17/2011  . Chest tightness 12/17/2011  . Creatinine elevation 12/17/2011  . THYROMEGALY 12/26/2009  . TOBACCO ABUSE 12/25/2009  . SORE THROAT 12/25/2009  . COSTOCHONDRITIS 09/09/2007  . HYPERCHOLESTEROLEMIA 11/25/2006  . GERD 11/25/2006  . BACK STRAIN, LUMBAR 11/11/2006  . HERNIA, HX OF 02/12/1999    Past Surgical History:  Procedure Laterality Date  . HERNIA REPAIR          Home Medications    Prior to Admission medications   Medication Sig Start Date End Date Taking? Authorizing Provider  Aspirin-Caffeine (BC FAST PAIN RELIEF) 845-65 MG PACK Take 1 Package by mouth as needed (back pain).   Yes [provider]  Rivaroxaban 15 & 20 MG TBPK Take as directed on package: Start with one 15mg  tablet by mouth twice a day with food. On Day 22, switch to one 20mg  tablet once a day with food. Patient taking differently: Take 15-20 mg by mouth daily at 12 noon. Take as directed on package: Start  with one 15mg  tablet by mouth twice a day with food. On Day 22, switch to one 20mg  tablet once a day with food. 02/11/18  Yes Couture, Cortni S, PA-C  cyclobenzaprine (FLEXERIL) 10 MG tablet Take 1 tablet (10 mg total) by mouth 2 (two) times daily as needed for muscle spasms. Patient not taking: Reported on 07/24/2017 12/13/15   Teressa LowerPickering, Vrinda, NP  gabapentin (NEURONTIN) 300 MG capsule Take 1 capsule (300 mg total) by mouth 3 (three) times daily. Patient not taking: Reported on 07/24/2017 01/23/12   Glori LuisSonnenberg, Eric G, MD  ibuprofen (ADVIL,MOTRIN) 800 MG tablet Take 1 tablet (800 mg total) by mouth 3 (three) times daily. Patient not taking: Reported on 07/24/2017 12/13/15   Teressa LowerPickering, Vrinda, NP  pantoprazole (PROTONIX) 20 MG tablet Take 1 tablet (20 mg total) by mouth daily. Patient not taking: Reported on 02/16/2018 12/03/17 01/02/18  Tilden Fossaees, Elizabeth, MD  sucralfate (CARAFATE) 1 GM/10ML suspension Take 10 mLs (1 g total) by mouth 2 (two) times daily for 14 days. Patient not taking: Reported on 12/03/2017 07/24/17 08/07/17  Windy CarinaMortis, Gabrielle I, PA-C    Family History Family History  Problem Relation Age of Onset  . Heart failure Father   . Hypertension Mother     Social History Social History   Tobacco Use  . Smoking status: Former Smoker    Packs/day: 0.50    Types: Cigarettes  Last attempt to quit: 11/16/2011    Years since quitting: 6.2  . Smokeless tobacco: Never Used  Substance Use Topics  . Alcohol use: No  . Drug use: No     Allergies   Shellfish allergy   Review of Systems Review of Systems  All other systems reviewed and are negative.    Physical Exam Updated Vital Signs BP 116/83   Pulse (!) 59   Resp (!) 0   Ht 6' (1.829 m)   Wt 86.2 kg   SpO2 100%   BMI 25.77 kg/m   Physical Exam Vitals signs and nursing note reviewed.  Constitutional:      Appearance: He is well-developed.  HENT:     Head: Normocephalic and atraumatic.  Neck:     Musculoskeletal:  Normal range of motion.  Cardiovascular:     Rate and Rhythm: Normal rate and regular rhythm.     Heart sounds: Normal heart sounds.  Pulmonary:     Effort: Pulmonary effort is normal. No respiratory distress.     Breath sounds: Normal breath sounds.  Abdominal:     General: There is no distension.     Palpations: Abdomen is soft.     Tenderness: There is no abdominal tenderness.  Musculoskeletal: Normal range of motion.  Skin:    General: Skin is warm and dry.  Neurological:     Mental Status: He is alert and oriented to person, place, and time.  Psychiatric:        Judgment: Judgment normal.      ED Treatments / Results  Labs (all labs ordered are listed, but only abnormal results are displayed) Labs Reviewed  CBC WITH DIFFERENTIAL/PLATELET - Abnormal; Notable for the following components:      Result Value   RBC 3.98 (*)    Hemoglobin 11.6 (*)    HCT 36.2 (*)    RDW 17.6 (*)    All other components within normal limits  BASIC METABOLIC PANEL - Abnormal; Notable for the following components:   Sodium 134 (*)    Creatinine, Ser 1.25 (*)    All other components within normal limits  TROPONIN I  BRAIN NATRIURETIC PEPTIDE  I-STAT TROPONIN, ED    EKG ECG interpretation   Date: 02/16/2018  Rate: 79  Rhythm: normal sinus rhythm  QRS Axis: normal  Intervals: normal  ST/T Wave abnormalities: normal  Conduction Disutrbances: none  Narrative Interpretation:   Old EKG Reviewed: No significant changes noted    Radiology No results found.  Procedures Procedures (including critical care time)  Medications Ordered in ED Medications  morphine 4 MG/ML injection 4 mg (4 mg Intravenous Given 02/16/18 1030)  iopamidol (ISOVUE-370) 76 % injection (100 mLs  Contrast Given 02/16/18 1223)     Initial Impression / Assessment and Plan / ED Course  I have reviewed the triage vital signs and the nursing notes.  Pertinent labs & imaging results that were available during my  care of the patient were reviewed by me and considered in my medical decision making (see chart for details).     Patient is overall well-appearing.  Atypical chest pain.  Doubt ACS.  Doubt dissection.  CT angiogram of his chest performed which demonstrates no evidence of pulmonary embolism.  He feels much better this time.  Close primary care follow-up.  Patient will continue his Xarelto for known DVT.  Final Clinical Impressions(s) / ED Diagnoses   Final diagnoses:  Precordial chest pain    ED Discharge  Orders    None       Azalia Bilisampos, Jolene Guyett, MD 02/16/18 1655

## 2018-02-16 NOTE — ED Triage Notes (Signed)
Pt arrives to ED from home with complaints of chest pain starting this morning. Pt reports he was diagnosed with DVT's in his leg last week and was started on xarelto. Pt denies SOB but CP is 7/10. Pt placed in position of comfort with bed locked and lowered, call bell in reach.

## 2018-04-11 ENCOUNTER — Other Ambulatory Visit: Payer: Self-pay

## 2018-04-11 ENCOUNTER — Encounter (HOSPITAL_COMMUNITY): Payer: Self-pay

## 2018-04-11 ENCOUNTER — Emergency Department (HOSPITAL_COMMUNITY)
Admission: EM | Admit: 2018-04-11 | Discharge: 2018-04-11 | Disposition: A | Discharge status: Home / Self Care | Payer: Self-pay | Attending: Emergency Medicine | Admitting: Emergency Medicine

## 2018-04-11 ENCOUNTER — Emergency Department (HOSPITAL_COMMUNITY): Payer: Self-pay

## 2018-04-11 DIAGNOSIS — E7800 Pure hypercholesterolemia, unspecified: Secondary | ICD-10-CM | POA: Insufficient documentation

## 2018-04-11 DIAGNOSIS — K30 Functional dyspepsia: Secondary | ICD-10-CM | POA: Insufficient documentation

## 2018-04-11 DIAGNOSIS — R0789 Other chest pain: Secondary | ICD-10-CM | POA: Insufficient documentation

## 2018-04-11 DIAGNOSIS — R1013 Epigastric pain: Secondary | ICD-10-CM

## 2018-04-11 DIAGNOSIS — F17210 Nicotine dependence, cigarettes, uncomplicated: Secondary | ICD-10-CM | POA: Insufficient documentation

## 2018-04-11 DIAGNOSIS — Z86718 Personal history of other venous thrombosis and embolism: Secondary | ICD-10-CM | POA: Insufficient documentation

## 2018-04-11 LAB — COMPREHENSIVE METABOLIC PANEL
ALT: 20 U/L (ref 0–44)
ANION GAP: 11 (ref 5–15)
AST: 38 U/L (ref 15–41)
Albumin: 4.2 g/dL (ref 3.5–5.0)
Alkaline Phosphatase: 44 U/L (ref 38–126)
BUN: 16 mg/dL (ref 6–20)
CHLORIDE: 104 mmol/L (ref 98–111)
CO2: 22 mmol/L (ref 22–32)
Calcium: 9.3 mg/dL (ref 8.9–10.3)
Creatinine, Ser: 1.26 mg/dL — ABNORMAL HIGH (ref 0.61–1.24)
GFR calc Af Amer: >60 mL/min (ref >60)
Glucose, Bld: 89 mg/dL (ref 70–99)
POTASSIUM: 3.9 mmol/L (ref 3.5–5.1)
Sodium: 137 mmol/L (ref 135–145)
Total Bilirubin: 0.6 mg/dL (ref 0.3–1.2)
Total Protein: 7.7 g/dL (ref 6.5–8.1)

## 2018-04-11 LAB — CBC WITH DIFFERENTIAL/PLATELET
ABS IMMATURE GRANULOCYTES: 0.02 10*3/uL (ref 0.00–0.07)
BASOS PCT: 1 %
Basophils Absolute: 0.1 10*3/uL (ref 0.0–0.1)
Eosinophils Absolute: 0.1 10*3/uL (ref 0.0–0.5)
Eosinophils Relative: 1 %
HEMATOCRIT: 38.7 % — AB (ref 39.0–52.0)
Hemoglobin: 12.6 g/dL — ABNORMAL LOW (ref 13.0–17.0)
IMMATURE GRANULOCYTES: 0 %
LYMPHS PCT: 39 %
Lymphs Abs: 2.4 10*3/uL (ref 0.7–4.0)
MCH: 31.4 pg (ref 26.0–34.0)
MCHC: 32.6 g/dL (ref 30.0–36.0)
MCV: 96.5 fL (ref 80.0–100.0)
MONO ABS: 0.4 10*3/uL (ref 0.1–1.0)
Monocytes Relative: 6 %
NEUTROS ABS: 3.2 10*3/uL (ref 1.7–7.7)
NEUTROS PCT: 53 %
NRBC: 0 % (ref 0.0–0.2)
Platelets: 274 10*3/uL (ref 150–400)
RBC: 4.01 MIL/uL — ABNORMAL LOW (ref 4.22–5.81)
RDW: 17.3 % — ABNORMAL HIGH (ref 11.5–15.5)
WBC: 6.2 10*3/uL (ref 4.0–10.5)

## 2018-04-11 LAB — LIPASE, BLOOD: Lipase: 30 U/L (ref 11–51)

## 2018-04-11 LAB — TROPONIN I

## 2018-04-11 MED ORDER — FAMOTIDINE 20 MG PO TABS: 20.0000 mg | ORAL_TABLET | Freq: Two times a day (BID) | ORAL | 0 refills | Status: AC

## 2018-04-11 MED ORDER — ALUM & MAG HYDROXIDE-SIMETH 200-200-20 MG/5ML PO SUSP
30.0000 mL | Freq: Once | ORAL | Status: AC
  Administered 2018-04-11: 30 mL via ORAL
  Filled 2018-04-11: qty 30

## 2018-04-11 MED ORDER — ONDANSETRON HCL 4 MG/2ML IJ SOLN
4.0000 mg | Freq: Once | INTRAMUSCULAR | Status: AC
  Administered 2018-04-11: 4 mg via INTRAVENOUS
  Filled 2018-04-11: qty 2

## 2018-04-11 MED ORDER — LIDOCAINE VISCOUS HCL 2 % MT SOLN
15.0000 mL | Freq: Once | OROMUCOSAL | Status: AC
  Administered 2018-04-11: 15 mL via ORAL
  Filled 2018-04-11: qty 15

## 2018-04-11 MED ORDER — ACETAMINOPHEN 325 MG PO TABS
650.0000 mg | ORAL_TABLET | Freq: Once | ORAL | Status: AC
  Administered 2018-04-11: 650 mg via ORAL
  Filled 2018-04-11: qty 2

## 2018-04-11 MED ORDER — SODIUM CHLORIDE 0.9 % IV BOLUS
1000.0000 mL | Freq: Once | INTRAVENOUS | Status: AC
  Administered 2018-04-11: 1000 mL via INTRAVENOUS

## 2018-04-11 MED ORDER — MORPHINE SULFATE (PF) 4 MG/ML IV SOLN
4.0000 mg | Freq: Once | INTRAVENOUS | Status: AC
  Administered 2018-04-11: 4 mg via INTRAVENOUS
  Filled 2018-04-11: qty 1

## 2018-04-11 MED ORDER — IOHEXOL 350 MG/ML SOLN
80.0000 mL | Freq: Once | INTRAVENOUS | Status: AC | PRN
  Administered 2018-04-11: 80 mL via INTRAVENOUS

## 2018-04-11 MED ORDER — PROMETHAZINE HCL 25 MG PO TABS: 25.0000 mg | ORAL_TABLET | Freq: Three times a day (TID) | ORAL | 0 refills | Status: AC | PRN

## 2018-04-11 MED ORDER — RIVAROXABAN 20 MG PO TABS: 20.0000 mg | ORAL_TABLET | Freq: Every day | ORAL | 0 refills | Status: AC

## 2018-04-11 MED ORDER — RIVAROXABAN 20 MG PO TABS
20.0000 mg | ORAL_TABLET | Freq: Every day | ORAL | Status: DC
  Administered 2018-04-11: 20 mg via ORAL
  Filled 2018-04-11: qty 1

## 2018-04-11 NOTE — ED Notes (Signed)
ED Provider at bedside. 

## 2018-04-11 NOTE — ED Notes (Signed)
Pt given [place] sandwich and drink; okay to eat per Dr. Madilyn Hook.

## 2018-04-11 NOTE — ED Notes (Signed)
Patient transported to CT 

## 2018-04-11 NOTE — ED Triage Notes (Signed)
Pt arrives by [facility] c/o chest pain that started around 2 am this morning. Pt also states that he has had a cough "for about a week." Pt has hx of DVTs and is on Xarelto but has not been taking his medication. Pt states "he cannot afford" Xarelto. Pt reports weakness in his legs bilaterally; reports hx of DVT in legs. A&O , VSS at this time.  EMS vitals: 140/100 99% O2 on RA Hr 70 RR 16  Given 324 mg aspirin

## 2018-04-11 NOTE — Progress Notes (Signed)
CSW met with patient to discuss homelessness. CSW noted RNCM provided homeless resources to patient. CSW provided patient with a bus pass to assist with transportation to pharmacy and to motel. CSW encouraged patient to follow up with the [facility] on the Weekdays.   Lamonte Richer, LCSW, [place] Worker II [phone]

## 2018-04-11 NOTE — ED Notes (Signed)
Patient verbalizes understanding of discharge instructions. Opportunity for questioning and answers were provided. Armband removed by staff, pt discharged from ED.  

## 2018-04-11 NOTE — ED Provider Notes (Signed)
[facility] EMERGENCY DEPARTMENT Provider Note   CSN: [id number] Arrival date & time: 04/11/18  1062    History   Chief Complaint Chief Complaint  Patient presents with  . Chest Pain  . Cough    HPI Curtis Barr is a 53 y.o. male.     The history is provided by the patient and medical records. No language interpreter was used.  Chest Pain  Associated symptoms: cough   Cough  Associated symptoms: chest pain    Curtis Barr is a 53 y.o. male who presents to the Emergency Department complaining of chest pain.  He reports several days of intermittent left sided chest pain.  Pain is sharp in nature.  No change with activity, meals, positioning, breathing.  Pain episodes last a few seconds and come every few minutes.  Overall sxs are worsening.  He has associated diaphoresis, night sweats, chills, sob, cough, vomiting in the mornings (x1wk), BLE leg pain/swelling.  Denies fevers.  He has a hx/o DVT and is supposed to take Xarelto but stopped taking b/c due to inability to afford the medication.  He smokes tobacco, occasional alcohol, denies street drugs.   Past Medical History:  Diagnosis Date  . Allergy    seasonal  . DVT (deep venous thrombosis) [facility])     Patient Active Problem List   Diagnosis Date Noted  . Sciatica of left side 12/17/2011  . Chest tightness 12/17/2011  . Creatinine elevation 12/17/2011  . THYROMEGALY 12/26/2009  . TOBACCO ABUSE 12/25/2009  . SORE THROAT 12/25/2009  . COSTOCHONDRITIS 09/09/2007  . HYPERCHOLESTEROLEMIA 11/25/2006  . GERD 11/25/2006  . BACK STRAIN, LUMBAR 11/11/2006  . HERNIA, HX OF 02/12/1999    Past Surgical History:  Procedure Laterality Date  . HERNIA REPAIR          Home Medications    Prior to Admission medications   Medication Sig Start Date End Date Taking? Authorizing Provider  cyclobenzaprine (FLEXERIL) 10 MG tablet Take 1 tablet (10 mg total) by mouth 2 (two) times daily as needed for  muscle spasms. Patient not taking: Reported on 07/24/2017 12/13/15   Glendell Docker, NP  famotidine (PEPCID) 20 MG tablet Take 1 tablet (20 mg total) by mouth 2 (two) times daily. 04/11/18   Quintella Reichert, MD  gabapentin (NEURONTIN) 300 MG capsule Take 1 capsule (300 mg total) by mouth 3 (three) times daily. Patient not taking: Reported on 07/24/2017 01/23/12   Leone Haven, MD  pantoprazole (PROTONIX) 20 MG tablet Take 1 tablet (20 mg total) by mouth daily. Patient not taking: Reported on 02/16/2018 12/03/17 01/02/18  Quintella Reichert, MD  promethazine (PHENERGAN) 25 MG tablet Take 1 tablet (25 mg total) by mouth every 8 (eight) hours as needed for nausea or vomiting. 04/11/18   Quintella Reichert, MD  rivaroxaban (XARELTO) 20 MG TABS tablet Take 1 tablet (20 mg total) by mouth daily with supper. 04/11/18   Quintella Reichert, MD  sucralfate (CARAFATE) 1 GM/10ML suspension Take 10 mLs (1 g total) by mouth 2 (two) times daily for 14 days. Patient not taking: Reported on 12/03/2017 07/24/17 08/07/17  Romie Jumper, PA-C    Family History Family History  Problem Relation Age of Onset  . Heart failure Father   . Hypertension Mother     Social History Social History   Tobacco Use  . Smoking status: Current Every Day Smoker    Types: Cigarettes    Last attempt to quit: 11/16/2011  Years since quitting: 6.4  . Smokeless tobacco: Never Used  . Tobacco comment: states he  smokes 3 cigarettes/day  Substance Use Topics  . Alcohol use: Yes    Comment: occasionally   . Drug use: No     Allergies   Shellfish allergy   Review of Systems Review of Systems  Respiratory: Positive for cough.   Cardiovascular: Positive for chest pain.  All other systems reviewed and are negative.    Physical Exam Updated Vital Signs BP 119/86   Pulse (!) 52   Temp 98.5 F (36.9 C) (Oral)   Resp 12   Ht 6' (1.829 m)   Wt 83.9 kg   SpO2 100%   BMI 25.09 kg/m   Physical Exam Vitals signs and  nursing note reviewed.  Constitutional:      Appearance: He is well-developed.  HENT:     Head: Normocephalic and atraumatic.  Cardiovascular:     Rate and Rhythm: Normal rate and regular rhythm.     Heart sounds: No murmur.  Pulmonary:     Effort: Pulmonary effort is normal. No respiratory distress.     Breath sounds: Normal breath sounds.  Chest:     Chest wall: Tenderness present.  Abdominal:     Palpations: Abdomen is soft.     Tenderness: There is no guarding or rebound.     Comments: Mild epigastric tenderness  Musculoskeletal:        General: No swelling or tenderness.  Skin:    General: Skin is warm and dry.  Neurological:     Mental Status: He is alert and oriented to person, place, and time.  Psychiatric:        Behavior: Behavior normal.      ED Treatments / Results  Labs (all labs ordered are listed, but only abnormal results are displayed) Labs Reviewed  COMPREHENSIVE METABOLIC PANEL - Abnormal; Notable for the following components:      Result Value   Creatinine, Ser 1.26 (*)    All other components within normal limits  CBC WITH DIFFERENTIAL/PLATELET - Abnormal; Notable for the following components:   RBC 4.01 (*)    Hemoglobin 12.6 (*)    HCT 38.7 (*)    RDW 17.3 (*)    All other components within normal limits  LIPASE, BLOOD  TROPONIN I    EKG EKG Interpretation  Date/Time:  Saturday April 11 2018 08:56:50 EST Ventricular Rate:  73 PR Interval:    QRS Duration: 99 QT Interval:  403 QTC Calculation: 445 R Axis:   106 Text Interpretation:  Sinus rhythm Right axis deviation No significant change since last tracing Confirmed by Quintella Reichert [phone]) on 04/11/2018 9:50:43 AM   Radiology Ct Angio Chest Pe W/cm &/or Wo Cm  Result Date: 04/11/2018 CLINICAL DATA:  Shortness of breath and chest pain for 2 days EXAM: CT ANGIOGRAPHY CHEST WITH CONTRAST TECHNIQUE: Multidetector CT imaging of the chest was performed using the standard protocol  during bolus administration of intravenous contrast. Multiplanar CT image reconstructions and MIPs were obtained to evaluate the vascular anatomy. CONTRAST:  38m OMNIPAQUE 350 COMPARISON:  02/16/2018. FINDINGS: Cardiovascular: Thoracic aorta demonstrates no significant aneurysmal dilatation. Evaluation for dissection is limited due to timing of the contrast bolus. The pulmonary artery demonstrates a normal branching pattern. No filling defect to suggest pulmonary embolism is noted. No sizable pericardial effusion is noted. No coronary calcifications are seen. Mediastinum/Nodes: Thoracic inlet is within normal limits. No significant hilar or mediastinal adenopathy is  noted. The esophagus is within normal limits. Lungs/Pleura: Lungs are well aerated bilaterally. Mild dependent atelectatic changes are seen. No focal confluent infiltrate or sizable effusion is noted. No parenchymal nodules are seen. Upper Abdomen: The visualized upper abdomen shows no acute abnormality. Musculoskeletal: No acute bony abnormality noted. Review of the MIP images confirms the above findings. IMPRESSION: No evidence of pulmonary emboli. Mild dependent atelectatic changes bilaterally. No other focal abnormality is noted. Electronically Signed   By: Inez Catalina M.D.   On: 04/11/2018 13:05    Procedures Procedures (including critical care time)  Medications Ordered in ED Medications  rivaroxaban (XARELTO) tablet 20 mg (has no administration in time range)  sodium chloride 0.9 % bolus 1,000 mL (0 mLs Intravenous Stopped 04/11/18 1153)  morphine 4 MG/ML injection 4 mg (4 mg Intravenous Given 04/11/18 1021)  ondansetron (ZOFRAN) injection 4 mg (4 mg Intravenous Given 04/11/18 1021)  alum & mag hydroxide-simeth (MAALOX/MYLANTA) 200-200-20 MG/5ML suspension 30 mL (30 mLs Oral Given 04/11/18 1018)    And  lidocaine (XYLOCAINE) 2 % viscous mouth solution 15 mL (15 mLs Oral Given 04/11/18 1018)  iohexol (OMNIPAQUE) 350 MG/ML injection 80  mL (80 mLs Intravenous Contrast Given 04/11/18 1221)  acetaminophen (TYLENOL) tablet 650 mg (650 mg Oral Given 04/11/18 1455)     Initial Impression / Assessment and Plan / ED Course  I have reviewed the triage vital signs and the nursing notes.  Pertinent labs & imaging results that were available during my care of the patient were reviewed by me and considered in my medical decision making (see chart for details).        Patient with recent history of DVT, not currently on his anticoagulation here for evaluation of sharp chest pain. Imaging is negative for PE. Presentation is not consistent with ACS, dissection, pneumonia. Case management met with the patient to arrange affordable Xarelto. In terms of nausea vomiting, question some element of reflux, will treat with Pepcid. Discussed with patient home care for atypical chest pain. Discussed outpatient follow-up and return precautions.  Final Clinical Impressions(s) / ED Diagnoses   Final diagnoses:  Atypical chest pain  Dyspepsia    ED Discharge Orders         Ordered    famotidine (PEPCID) 20 MG tablet  2 times daily     04/11/18 1507    promethazine (PHENERGAN) 25 MG tablet  Every 8 hours PRN     04/11/18 1507    rivaroxaban (XARELTO) 20 MG TABS tablet  Daily with supper     04/11/18 1518           Quintella Reichert, MD 04/11/18 1520

## 2018-04-11 NOTE — Care Management (Signed)
ED CM met with patient need follow up for DVT and to restart Xarelto. Enrolled in [place] to assist with Xarelto since patient has already used the [address] free [address] card. Patient is homeless so waived the copay Also discussed the [facility] as the Medical and SW assistance patient was given written resources and with patient's permission sent referral to Audrea Muscat NP at [facility]

## 2018-04-11 NOTE — ED Notes (Signed)
Pt transported back to room from CT.  

## 2018-04-11 NOTE — Progress Notes (Signed)
ANTICOAGULATION CONSULT NOTE - Initial Consult  Pharmacy Consult for Xarelto Indication: DVT  Allergies  Allergen Reactions  . Shellfish Allergy Swelling    Mouth swelling    Patient Measurements: Height: 6' (182.9 cm) Weight: 185 lb (83.9 kg) IBW/kg (Calculated) : 77.6  Vital Signs: Temp: 98.5 F (36.9 C) (02/29 0916) Temp Source: Oral (02/29 0916) BP: 119/86 (02/29 1315) Pulse Rate: 52 (02/29 1315)  Labs: Recent Labs    04/11/18 0904  HGB 12.6*  HCT 38.7*  PLT 274  CREATININE 1.26*  TROPONINI <0.03    Estimated Creatinine Clearance: 77 mL/min (A) (by C-G formula based on SCr of 1.26 mg/dL (H)).   Medical History: Past Medical History:  Diagnosis Date  . Allergy    seasonal  . DVT (deep venous thrombosis) [facility])     Assessment: 53 yo M started on Xarelto in the beginning of January for PE. Seems to have taken ~1 month supply of it before running out and not continuing. ED provider wanted to restart. Saw in the ED several days after DVT diagnosis diagnosed and scan revealed no acute PE. Would suspect he took most or all of is 15mg  BID dosing as prescribed and then just did not continue the medication after that. Hgb 12.6, plts wnl.  Goal of Therapy:  Monitor platelets by anticoagulation protocol: Yes   Plan:  Restart Xarelto 20mg  PO daily Monitor CBC, s/s of bleed   Jousha Schwandt J 04/11/2018,2:35 PM

## 2018-04-13 ENCOUNTER — Encounter: Payer: Self-pay | Admitting: Pediatric Intensive Care

## 2018-04-13 NOTE — Congregational Nurse Program (Signed)
  Dept: [phone]   Congregational Nurse Program Note  Date of Encounter: 04/13/2018  Past Medical History: Past Medical History:  Diagnosis Date  . Allergy    seasonal  . DVT (deep venous thrombosis) (HCC)     Encounter Details: Client referred from [facility] via social work. Client states he is living in a motel with a friend and will lose room on Wednesday. Clients needs information regarding temporary shelter. CN called [facility] on behalf of client. Client states chest pain symptoms are resolved since starting medication GERD. He has no calf tenderness at this time. Client will submit new patient packet to [facility] and will make follow up visit. Follow up in CN clinic as needed.

## 2019-04-30 IMAGING — CT CT ANGIO CHEST
2 of 7 series · 19 of 46 positions shown · IV contrast (APPLIED)
Comparison: 02/16/2018.

CLINICAL DATA: Shortness of breath and chest pain for 2 days

EXAM:
CT ANGIOGRAPHY CHEST WITH CONTRAST
TECHNIQUE: Multidetector CT imaging of the chest was performed using the
standard protocol during bolus administration of intravenous
contrast. Multiplanar CT image reconstructions and MIPs were
obtained to evaluate the vascular anatomy.
CONTRAST:  80mL OMNIPAQUE 350

[Series 7: thins · axial · 0.63mm/px · z∈[+1227,+1518]mm · 16 of 469 slices shown]
[im 27/469  lung]
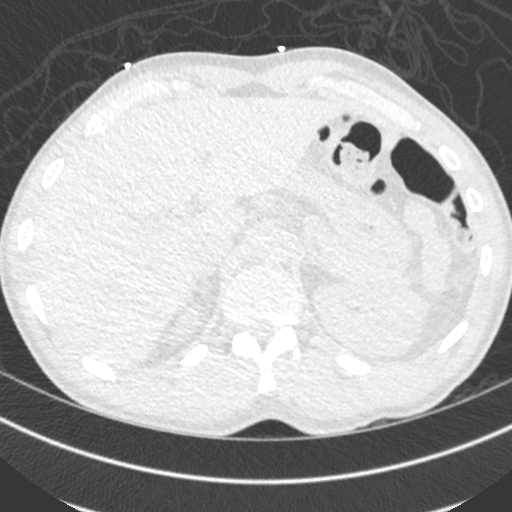
[im 53/469  soft-tissue]
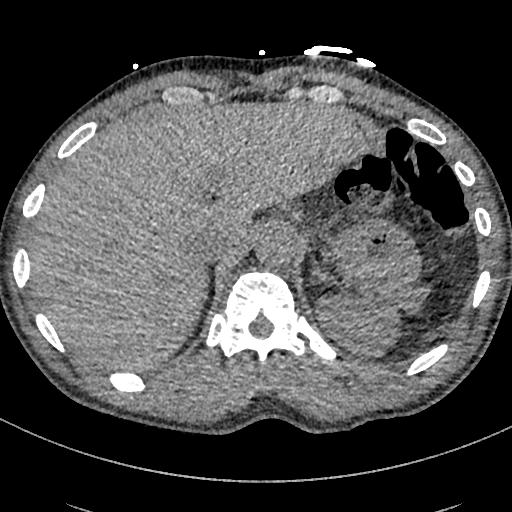
[im 79/469  lung]
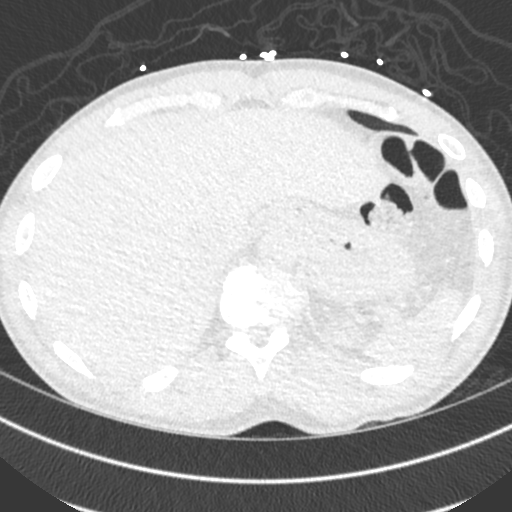
[im 105/469  soft-tissue]
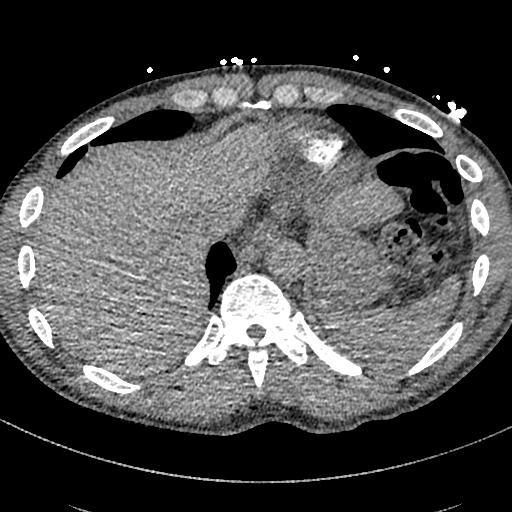
[im 131/469  lung]
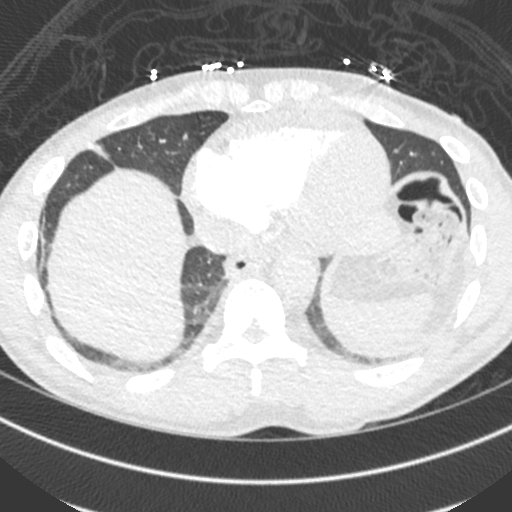
[im 157/469  soft-tissue]
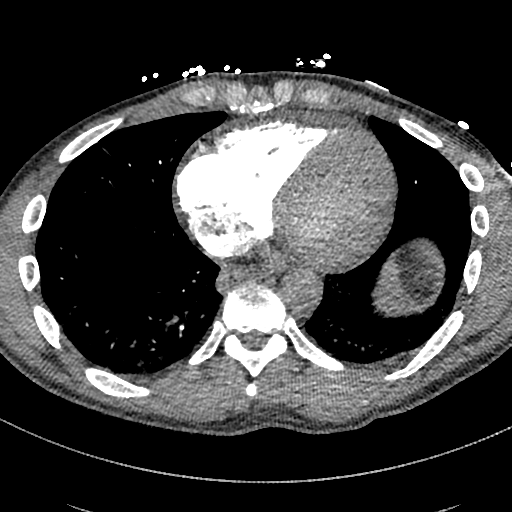
[im 183/469  lung]
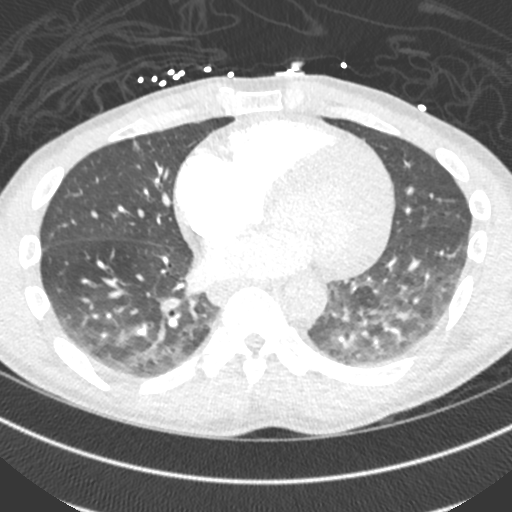
[im 209/469  soft-tissue]
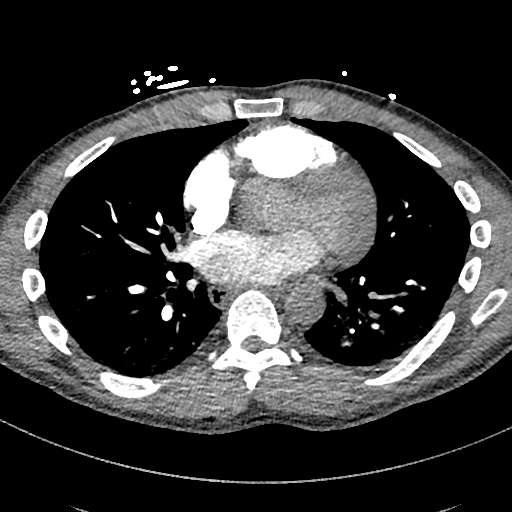
[im 261/469  lung]
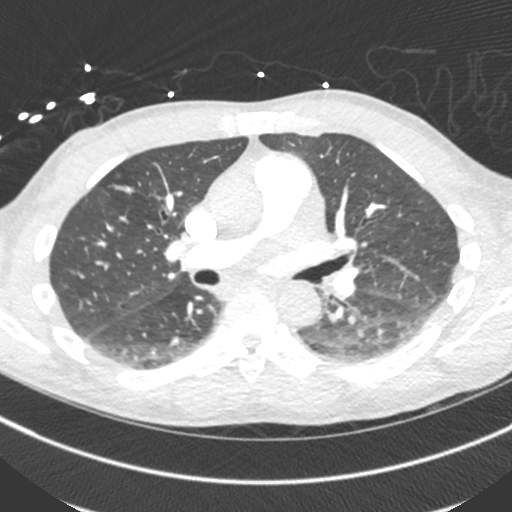
[im 287/469  soft-tissue]
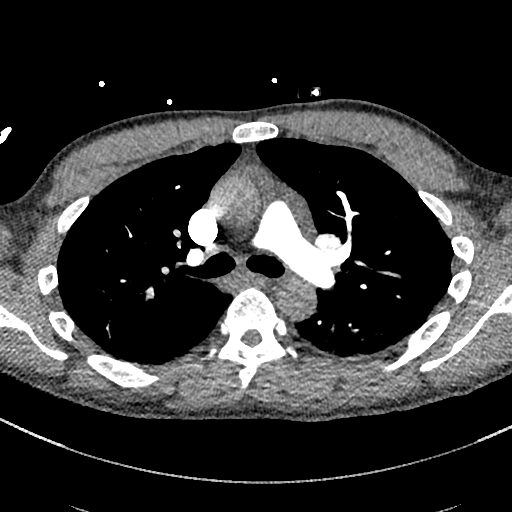
[im 313/469  lung]
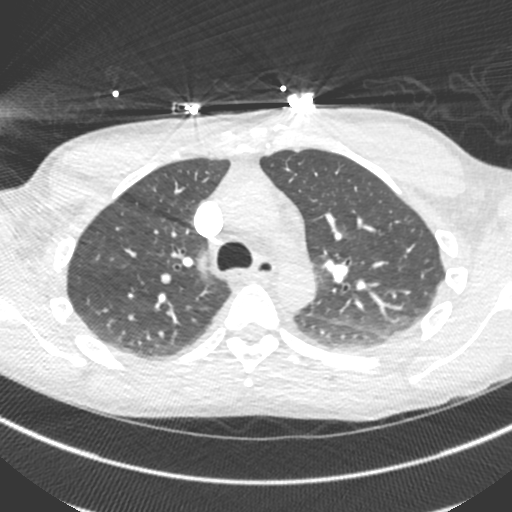
[im 339/469  soft-tissue]
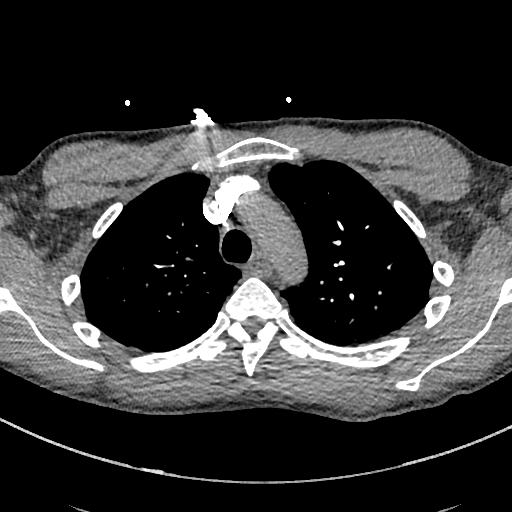
[im 365/469  lung]
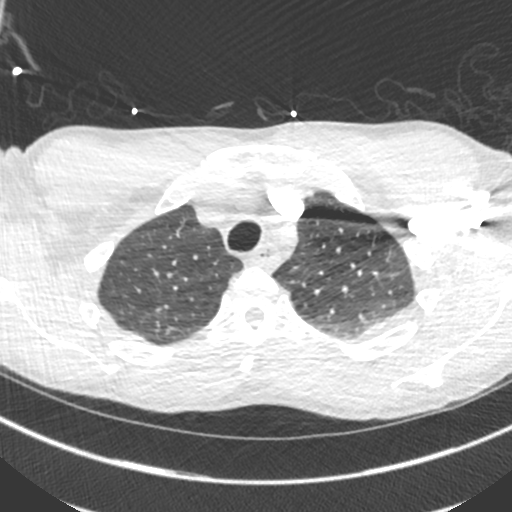
[im 391/469  soft-tissue]
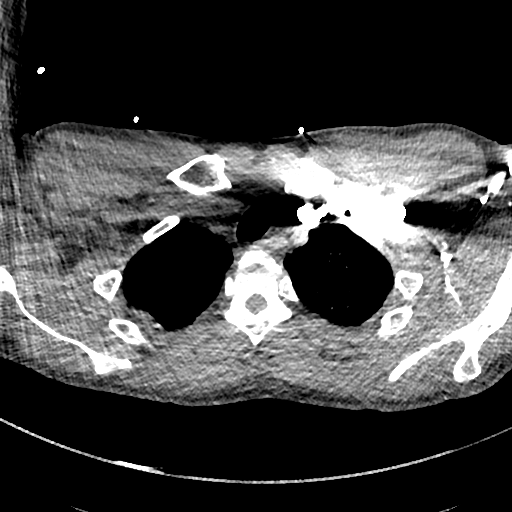
[im 417/469  lung]
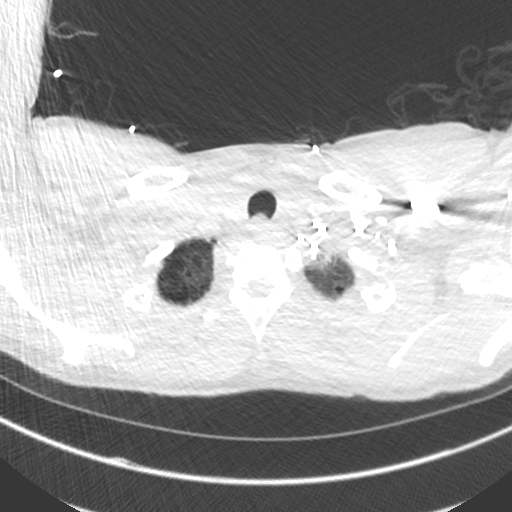
[im 443/469  soft-tissue]
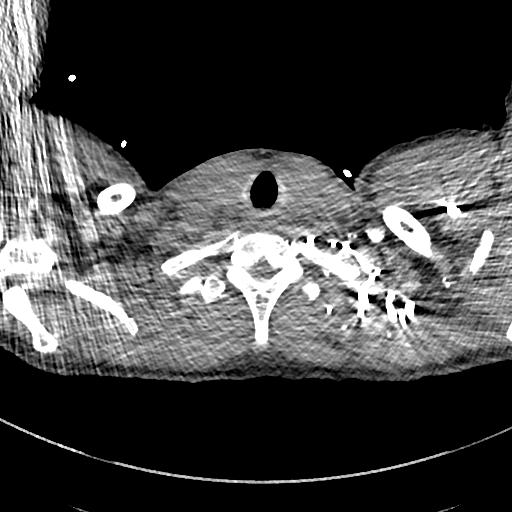

[Series 8: cor · coronal · 0.60mm/px · 3 of 103 slices shown]
[im 26/103  soft-tissue]
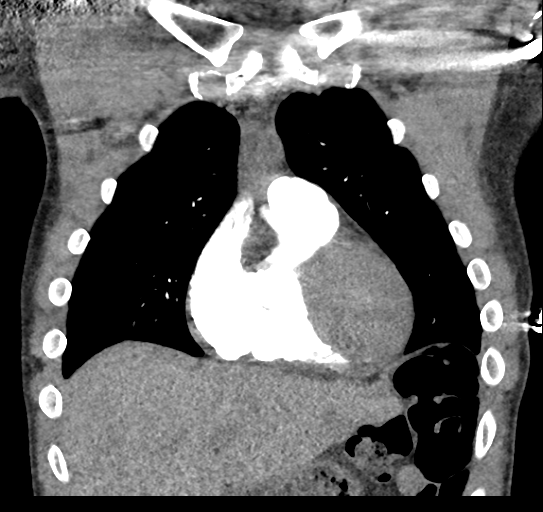
[im 52/103  soft-tissue]
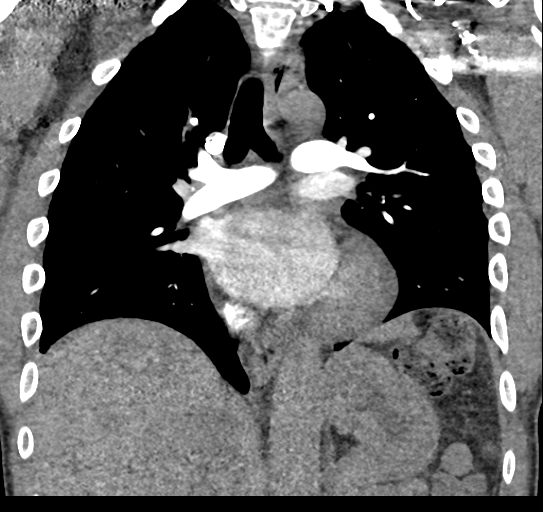
[im 77/103  soft-tissue]
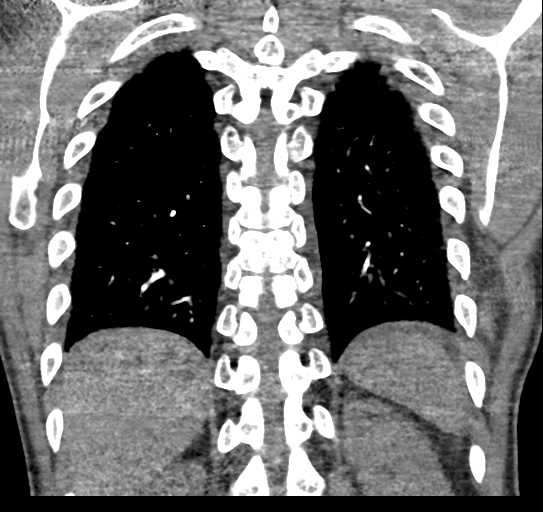

[19 of 46 positions shown; findings below may reference images not displayed]

FINDINGS: Cardiovascular: Thoracic aorta demonstrates no significant
aneurysmal dilatation. Evaluation for dissection is limited due to
timing of the contrast bolus. The pulmonary artery demonstrates a
normal branching pattern. No filling defect to suggest pulmonary
embolism is noted. No sizable pericardial effusion is noted. No
coronary calcifications are seen.

Mediastinum/Nodes: Thoracic inlet is within normal limits. No
significant hilar or mediastinal adenopathy is noted. The esophagus
is within normal limits.

Lungs/Pleura: Lungs are well aerated bilaterally. Mild dependent
atelectatic changes are seen. No focal confluent infiltrate or
sizable effusion is noted. No parenchymal nodules are seen.

Upper Abdomen: The visualized upper abdomen shows no acute
abnormality.

Musculoskeletal: No acute bony abnormality noted.

Review of the MIP images confirms the above findings.
IMPRESSION: No evidence of pulmonary emboli.

Mild dependent atelectatic changes bilaterally.

No other focal abnormality is noted.
